# Patient Record
Sex: Male | Born: 1995 | Race: White | Hispanic: No | Marital: Single | Smoking: Never smoker
Health system: Southern US, Community
[De-identification: ages and names within clinical notes are randomized; demographics above are authoritative.]

---
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
## 2017-08-10 ENCOUNTER — Encounter (HOSPITAL_COMMUNITY): Payer: Self-pay | Admitting: Emergency Medicine

## 2017-08-10 ENCOUNTER — Emergency Department (HOSPITAL_COMMUNITY)
Admission: EM | Admit: 2017-08-10 | Discharge: 2017-08-12 | Disposition: A | Discharge status: Home / Self Care | Payer: Self-pay | Attending: Emergency Medicine | Admitting: Emergency Medicine

## 2017-08-10 ENCOUNTER — Other Ambulatory Visit: Payer: Self-pay

## 2017-08-10 DIAGNOSIS — F332 Major depressive disorder, recurrent severe without psychotic features: Secondary | ICD-10-CM | POA: Insufficient documentation

## 2017-08-10 DIAGNOSIS — R45851 Suicidal ideations: Secondary | ICD-10-CM | POA: Insufficient documentation

## 2017-08-10 LAB — COMPREHENSIVE METABOLIC PANEL
ALBUMIN: 4.4 g/dL (ref 3.5–5.0)
ALK PHOS: 39 U/L (ref 38–126)
ALT: 20 U/L (ref 0–44)
AST: 19 U/L (ref 15–41)
Anion gap: 7 (ref 5–15)
BILIRUBIN TOTAL: 1.9 mg/dL — AB (ref 0.3–1.2)
BUN: 14 mg/dL (ref 6–20)
CALCIUM: 9.3 mg/dL (ref 8.9–10.3)
CO2: 30 mmol/L (ref 22–32)
Chloride: 103 mmol/L (ref 98–111)
Creatinine, Ser: 0.98 mg/dL (ref 0.61–1.24)
GFR calc Af Amer: >60 mL/min (ref >60)
GFR calc non Af Amer: >60 mL/min (ref >60)
GLUCOSE: 105 mg/dL — AB (ref 70–99)
POTASSIUM: 3.9 mmol/L (ref 3.5–5.1)
Sodium: 140 mmol/L (ref 135–145)
Total Protein: 7.4 g/dL (ref 6.5–8.1)

## 2017-08-10 LAB — SALICYLATE LEVEL: Salicylate Lvl: <7 mg/dL (ref 2.8–30.0)

## 2017-08-10 LAB — RAPID URINE DRUG SCREEN, HOSP PERFORMED
AMPHETAMINES: NOT DETECTED
BARBITURATES: NOT DETECTED
Benzodiazepines: NOT DETECTED
COCAINE: NOT DETECTED
OPIATES: NOT DETECTED
TETRAHYDROCANNABINOL: NOT DETECTED

## 2017-08-10 LAB — CBC
HEMATOCRIT: 49.1 % (ref 39.0–52.0)
HEMOGLOBIN: 17.4 g/dL — AB (ref 13.0–17.0)
MCH: 30.1 pg (ref 26.0–34.0)
MCHC: 35.4 g/dL (ref 30.0–36.0)
MCV: 84.8 fL (ref 78.0–100.0)
Platelets: 277 10*3/uL (ref 150–400)
RBC: 5.79 MIL/uL (ref 4.22–5.81)
RDW: 13.4 % (ref 11.5–15.5)
WBC: 9.4 10*3/uL (ref 4.0–10.5)

## 2017-08-10 LAB — ETHANOL: Alcohol, Ethyl (B): <10 mg/dL (ref <10)

## 2017-08-10 LAB — ACETAMINOPHEN LEVEL: ACETAMINOPHEN (TYLENOL), SERUM: 12 ug/mL (ref 10–30)

## 2017-08-10 NOTE — ED Triage Notes (Signed)
Pt brought in by [employer] under IVC after making statements that he wanted to kill himself by driving his truck off a bridge. When pt questioned about this, pt stated that he still felt same way.

## 2017-08-10 NOTE — ED Provider Notes (Signed)
[facility] EMERGENCY DEPARTMENT Provider Note   CSN: [id number] Arrival date & time: 08/10/17  2220     History   Chief Complaint Chief Complaint  Patient presents with  . V70.1    HPI Nicholas Clayton is a 22 y.o. male.  Patient brought in by police after making suicidal statements to his mother whom he lives with.  He told his mother he did not want to live anymore and wanted to drive his truck off a bridge.  He states he feels depressed but does not have a plan to hurt himself at this time.  He denies hearing any voices.  He states he has had lots of nightmares recently.  He denies any homicidal thoughts.  He does not take any prescription medications and is never been treated for depression or anxiety.  He denies any illicit drug abuse.  Uses alcohol occasionally last use yesterday.  Denies any physical altercation.  Does complain of headache at this time.  No focal weakness, numbness or tingling.  The history is provided by the patient and the police.    History reviewed. No pertinent past medical history.  There are no active problems to display for this patient.   History reviewed. No pertinent surgical history.      Home Medications    Prior to Admission medications   Medication Sig Start Date End Date Taking? Authorizing Provider  acetaminophen-codeine (TYLENOL #3) 300-30 MG per tablet 1 po q4h prn pain, take with food 10/30/10   Ivery Quale, PA-C    Family History No family history on file.  Social History Social History   Tobacco Use  . Smoking status: Never Smoker  . Smokeless tobacco: Never Used  Substance Use Topics  . Alcohol use: Yes  . Drug use: Yes    Types: Methamphetamines, Marijuana     Allergies   Aspirin   Review of Systems Review of Systems  Constitutional: Negative for activity change, appetite change and fever.  HENT: Negative for congestion.   Respiratory: Negative for cough, chest tightness and shortness of breath.     Cardiovascular: Negative for chest pain.  Gastrointestinal: Negative for abdominal pain, nausea and vomiting.  Genitourinary: Negative for testicular pain.  Musculoskeletal: Negative for arthralgias and myalgias.  Skin: Negative for rash.  Psychiatric/Behavioral: Positive for behavioral problems, decreased concentration, dysphoric mood, self-injury, sleep disturbance and suicidal ideas. The patient is nervous/anxious.    all other systems are negative except as noted in the HPI and PMH.     Physical Exam Updated Vital Signs BP 137/84 (BP Location: Right Arm)   Pulse 76   Temp (!) 97.1 F (36.2 C) (Oral)   Resp 17   Ht 5\' 9"  (1.753 m)   Wt 63.5 kg (140 lb)   SpO2 100%   BMI 20.67 kg/m   Physical Exam  Constitutional: He is oriented to person, place, and time. He appears well-developed and well-nourished. No distress.  Flat affect  HENT:  Head: Normocephalic and atraumatic.  Mouth/Throat: Oropharynx is clear and moist. No oropharyngeal exudate.  Eyes: Pupils are equal, round, and reactive to light. Conjunctivae and EOM are normal.  Neck: Normal range of motion. Neck supple.  No meningismus.  Cardiovascular: Normal rate, regular rhythm, normal heart sounds and intact distal pulses.  No murmur heard. Pulmonary/Chest: Effort normal and breath sounds normal. No respiratory distress.  Abdominal: Soft. There is no tenderness. There is no rebound and no guarding.  Musculoskeletal: Normal range of motion. He exhibits  no edema or tenderness.  Neurological: He is alert and oriented to person, place, and time. No cranial nerve deficit. He exhibits normal muscle tone. Coordination normal.  No ataxia on finger to nose bilaterally. No pronator drift. 5/5 strength throughout. CN 2-12 intact.Equal grip strength. Sensation intact.   Skin: Skin is warm.  Psychiatric: He has a normal mood and affect. His behavior is normal.  Nursing note and vitals reviewed.    ED Treatments / Results   Labs (all labs ordered are listed, but only abnormal results are displayed) Labs Reviewed  COMPREHENSIVE METABOLIC PANEL - Abnormal; Notable for the following components:      Result Value   Glucose, Bld 105 (*)    Total Bilirubin 1.9 (*)    All other components within normal limits  CBC - Abnormal; Notable for the following components:   Hemoglobin 17.4 (*)    All other components within normal limits  ACETAMINOPHEN LEVEL - Abnormal; Notable for the following components:   Acetaminophen (Tylenol), Serum <10 (*)    All other components within normal limits  ETHANOL  SALICYLATE LEVEL  ACETAMINOPHEN LEVEL  RAPID URINE DRUG SCREEN, HOSP PERFORMED    EKG None  Radiology No results found.  Procedures Procedures (including critical care time)  Medications Ordered in ED Medications - No data to display   Initial Impression / Assessment and Plan / ED Course  I have reviewed the triage vital signs and the nursing notes.  Pertinent labs & imaging results that were available during my care of the patient were reviewed by me and considered in my medical decision making (see chart for details).    Patient brought in IVC by police after making suicidal statements. Calm and cooperative at this time.  Will check screening labs, TTS consult.   Screening labs show mild elevation of acetaminophen.  Patient admits to taking 4 pills of unknown dosage throughout the day today.  Repeat acetaminophen levels undetectable.  TTS consult is complete and patient meets inpatient criteria. He is medically clear for psychiatric placement and evaluation.  Holding orders placed.  Final Clinical Impressions(s) / ED Diagnoses   Final diagnoses:  Suicidal ideation    ED Discharge Orders    None       Vetta Couzens, Jeannett SeniorStephen, MD 08/11/17 [phone]

## 2017-08-10 NOTE — ED Notes (Signed)
IVC paperwork received and place in folder at [employer] desk.

## 2017-08-10 NOTE — ED Notes (Signed)
Pt wanded by security in triage. Pt brought by  [employer]  IVC papers were brought in by officer.

## 2017-08-11 LAB — ACETAMINOPHEN LEVEL

## 2017-08-11 MED ORDER — ONDANSETRON HCL 4 MG PO TABS: 4.0000 mg | ORAL_TABLET | Freq: Three times a day (TID) | ORAL | Status: DC | PRN

## 2017-08-11 MED ORDER — HYDROXYZINE HCL 25 MG PO TABS
25.0000 mg | ORAL_TABLET | Freq: Four times a day (QID) | ORAL | Status: DC | PRN
  Administered 2017-08-11 – 2017-08-12 (×2): 25 mg via ORAL
  Filled 2017-08-11 (×2): qty 1

## 2017-08-11 NOTE — ED Notes (Signed)
Pt escorted to the shower by this sitter °

## 2017-08-11 NOTE — ED Notes (Signed)
Pt used telephone to talk to brother.

## 2017-08-11 NOTE — ED Notes (Signed)
Pt refusing to eat. He says he is too nervous to eat.

## 2017-08-11 NOTE — BHH Counselor (Signed)
Clinician called tele-assessment cart and received a error message saying the cart could not be reached.   Clinician contacted Nicholas Clayton and cart issue. Nicholas Clayton reported, she was going to reboot the cart. Clincain expressed she will call the cart in about 20 minutes.   Nicholas Medalrey lese D Estover, MS, LP, CC Triage Specialist [phone]

## 2017-08-11 NOTE — BH Assessment (Addendum)
Tele Assessment Note   Patient Name: Nicholas Abrahamshomas L Jokerst MRN: [id number] Referring Physician: Dr. Manus Gunningancour. Location of Patient: [facility] Location of Provider: Behavioral Health TTS Department  Nicholas Clayton is an 22 y.o. male, who presents involuntary and unaccompanied to [facility]. Clinician asked the pt, "what brought you to the hospital?" Pt reported, he was sleep in bed his brothers work him up and two officers brought him to the hospital. Clinician asked the pt, "why does he think the police brought him to the hospital?" Pt reported, his family called, "I think they're worried about me." Clinician asked, "why are they worried about you?" Pt reported, "I don't know." Pt reported, he has not been himself, since last year around Christmas time when his mother was diagnosed with breast cancer. Pt reported, he began feeling the world is going on but he his standing still. During the assessment pt had difficulty explaining he's feelings. Pt reported, he feels death is easier than life. Pt reported, he has moments of suicidal thoughts and he is unsure of the triggers. Pt reported, he said he wanted to drive his truck off a bridge. Pt reported the following stressors: his cousin might go to jail, his brother's light bill is $1500, he heard his father is back using drugs, he's staying with his mother, having the same job for five years, not moving forward with his life, having a family members car not his own. Pt reported, laying in the bed thinking about his family's issues, which makes it difficult for him to go to sleep. The pt internalizes and creates ownership of his family issues/concerns. Pt reported, for the past three weeks he has been having nightmares about his family where he wakes up shaking. Pt reported, he can have access to weapons if he wanted. Pt denies, HI, AVH, and self-injurious behaviors.    Pt was IVC'd by his mother. Per IVC paperwork: "The respondent said he wanted to kill himself. He stated he  was ready to die. The respondent aid he would drive his truck into the bridge. The respondent is a danger to himself and needs to be examined."   Pt denies, abuse and substance use. Pt reported, drinking alcohol, last night. Pt can't recall the amount or type of alcohol consumed. Pt's UDS is negative. Pt denies, being linked to OPT resources (medication management and/or counseling.) Pt denies, previous inpatient admissions.  Pt presents crying quiet/awake in scrubs with logical/coherent speech. Pt's mood was depressed, helpless, despair. Pt's affect was flat. Pt's thought process was coherent/relevant. Pt's judgement was partial. Pt was oriented x3. Pt's concentration was normal. Pt's insight and impulse control was fair. Pt reported, if discharged from [facility] he could contract for safety. Clinician discussed three possible dispositions (discharge observation/re-evaluation, and inpatient treatment) with the pt.   Diagnosis: F33.2 Major Depressive Disorder, recurrent, severe without psychotic features.     Past Medical History: History reviewed. No pertinent past medical history.  History reviewed. No pertinent surgical history.  Family History: No family history on file.  Social History:  reports that he has never smoked. He has never used smokeless tobacco. He reports that he drinks alcohol. He reports that he has current or past drug history. Drugs: Methamphetamines and Marijuana.  Additional Social History:  Alcohol / Drug Use Pain Medications: See MAR Prescriptions: See MAR Over the Counter: See MAR History of alcohol / drug use?: Yes Substance #1 Name of Substance 1: Alcohol.  1 - Age of First Use: UTA. 1 - Amount (  size/oz): Pt reported, drinking alcohol, last night. Pt can't recall the amount or type of alcohol consumed.  1 - Frequency: UTA 1 - Duration: UTA 1 - Last Use / Amount: UTA  CIWA: CIWA-Ar BP: 137/84 Pulse Rate: 76 COWS:    Allergies:  Allergies  Allergen Reactions   . Aspirin Itching    Home Medications:  (Not in a hospital admission)  OB/GYN Status:  No LMP for male patient.  General Assessment Data Assessment unable to be completed: Yes Reason for not completing assessment: Error message on cart. Cart to be rebooted. Clinicain to check back.  Location of Assessment: [facility] TTS Assessment: In system Is this a Tele or Face-to-Face Assessment?: Tele Assessment Is this an Initial Assessment or a Re-assessment for this encounter?: Initial Assessment Marital status: Single Living Arrangements: Parent, Other relatives Can pt return to current living arrangement?: Yes Admission Status: Involuntary Is patient capable of signing voluntary admission?: No Referral Source: Self/Family/Friend Insurance type: Self-pay.      Crisis Care Plan Living Arrangements: Parent, Other relatives Legal Guardian: Other:(Self. ) Name of Psychiatrist: NA Name of Therapist: NA  Education Status Is patient currently in school?: No Is the patient employed, unemployed or receiving disability?: Employed  Risk to self with the past 6 months What has been your use of drugs/alcohol within the last 12 months?: Alcohol.  Previous Attempts/Gestures: No How many times?: 0 Other Self Harm Risks: Pt denies.  Triggers for Past Attempts: None known Intentional Self Injurious Behavior: None(Pt denies. ) Family Suicide History: No Recent stressful life event(s): Other (Comment)(Family, cousin going to jail, mom's cancer, ) Persecutory voices/beliefs?: No Depression: Yes Depression Symptoms: Feeling angry/irritable, Feeling worthless/self pity, Loss of interest in usual pleasures, Guilt, Fatigue, Isolating, Tearfulness, Insomnia, Despondent Substance abuse history and/or treatment for substance abuse?: No Suicide prevention information given to non-admitted patients: Not applicable  Risk to Others within the past 6 months Homicidal Ideation: No(Pt denies. ) Does patient  have any lifetime risk of violence toward others beyond the six months prior to admission? : No(Pt denies. ) Thoughts of Harm to Others: No(Pt denies. ) Current Homicidal Intent: No(Pt denies. ) Current Homicidal Plan: No(Pt denies. ) Access to Homicidal Means: No Identified Victim: NA History of harm to others?: No Assessment of Violence: None Noted Violent Behavior Description: NA Does patient have access to weapons?: No(Pt denies. ) Criminal Charges Pending?: No Does patient have a court date: No Is patient on probation?: No  Psychosis Hallucinations: None noted Delusions: None noted  Mental Status Report Appearance/Hygiene: In scrubs Eye Contact: Fair Motor Activity: Unremarkable Speech: Logical/coherent Level of Consciousness: Crying, Quiet/awake Mood: Depressed, Helpless, Despair Affect: Flat Anxiety Level: Moderate Thought Processes: Coherent, Relevant Judgement: Partial Orientation: Person, Place, Time Obsessive Compulsive Thoughts/Behaviors: None  Cognitive Functioning Concentration: Normal Memory: Recent Intact Is patient IDD: No Is patient DD?: No Insight: Fair Impulse Control: Fair Appetite: Poor Sleep: Decreased Total Hours of Sleep: (Pt reported, a few hours.) Vegetative Symptoms: Staying in bed  ADLScreening [facility] Assessment Services) Patient's cognitive ability adequate to safely complete daily activities?: Yes Patient able to express need for assistance with ADLs?: Yes Independently performs ADLs?: Yes (appropriate for developmental age)  Prior Inpatient Therapy Prior Inpatient Therapy: No  Prior Outpatient Therapy Prior Outpatient Therapy: No Does patient have an ACCT team?: No Does patient have Intensive In-House Services?  : No Does patient have [facility] services? : No Does patient have P4CC services?: No  ADL Screening (condition at time of admission)  Patient's cognitive ability adequate to safely complete daily activities?: Yes Is the  patient deaf or have difficulty hearing?: No Does the patient have difficulty seeing, even when wearing glasses/contacts?: No Does the patient have difficulty concentrating, remembering, or making decisions?: Yes Patient able to express need for assistance with ADLs?: Yes Does the patient have difficulty dressing or bathing?: No Independently performs ADLs?: Yes (appropriate for developmental age) Does the patient have difficulty walking or climbing stairs?: No Weakness of Legs: None Weakness of Arms/Hands: None  Home Assistive Devices/Equipment Home Assistive Devices/Equipment: None    Abuse/Neglect Assessment (Assessment to be complete while patient is alone) Abuse/Neglect Assessment Can Be Completed: Yes Physical Abuse: Denies(Pt denies. ) Verbal Abuse: Denies(Pt denies. ) Sexual Abuse: Denies(Pt denies. ) Exploitation of patient/patient's resources: Denies(Pt denies. ) Self-Neglect: Denies(Pt denies. )         Disposition: Nicholas Sievert, PA recommends inpatient treatment. Disposition discussed with Dr. Manus Gunning. Dr. Manus Gunning to inform Ginger, RN of disposition. TTS to seek placement.    Disposition Initial Assessment Completed for this Encounter: Yes  This service was provided via telemedicine using a 2-way, interactive audio and video technology.  Names of all persons participating in this telemedicine service and their role in this encounter.               Redmond Pulling 08/11/2017 1:20 AM   Redmond Pulling, MS, [facility], [facility] Triage Specialist [phone]

## 2017-08-11 NOTE — ED Notes (Signed)
[facility]

## 2017-08-11 NOTE — ED Notes (Signed)
Pt given dinner tray.

## 2017-08-11 NOTE — ED Notes (Signed)
Pt given breakfast tray

## 2017-08-11 NOTE — ED Notes (Addendum)
Pt given lunch tray.

## 2017-08-11 NOTE — Progress Notes (Signed)
Pt meets inpatient criteria per Donell SievertSpencer Simon, PA. Referral information has been sent [facility] the [facility]: [facility]  [facility]  [facility]  [facility]   CSW will continue to assist with placement needs. CSW spoke with Durward Mallardamille in [facility] and assessed that there was currently no bed availability but that admissions staff will consider pt if bed availability changes tomorrow.   Wells GuilesSarah Jaely Silman, LCSW, LCAS Disposition CSW [facility]/TTS [phone]

## 2017-08-12 ENCOUNTER — Encounter: Payer: Self-pay | Admitting: Psychiatry

## 2017-08-12 DIAGNOSIS — F322 Major depressive disorder, single episode, severe without psychotic features: Secondary | ICD-10-CM | POA: Diagnosis present

## 2017-08-12 DIAGNOSIS — R45851 Suicidal ideations: Secondary | ICD-10-CM
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
## 2017-08-12 NOTE — BH Assessment (Signed)
[facility] by Dr. Jennet MaduroPucilowska.  Attending Physician will be Dr. Jennet MaduroPucilowska.   Patient has been assigned to room [facility]B, [facility] T'Y[facility].    [facility] Disposition SW Wells GuilesSarah Marshall has been made aware.

## 2017-08-12 NOTE — Progress Notes (Signed)
[facility]; room #315-B Dr. Homero FellersPusalowska is the accepting/attending provider.   Call report to [phone] [facility] notified. Pt is involuntary and will need to be transported by law enforcement.  [facility] admissions staff will notify [facility] RN about arrival time when calling in report.   Wells GuilesSarah Icela Glymph, LCSW, LCAS Disposition CSW [facility] B[facility]/TTS [phone]

## 2017-08-12 NOTE — ED Provider Notes (Signed)
  Physical Exam  BP 113/74   Pulse 74   Temp 98 F (36.7 C)   Resp 18   Ht 5\' 9"  (1.753 m)   Wt 63.5 kg (140 lb)   SpO2 100%   BMI 20.67 kg/m   Physical Exam  ED Course/Procedures     Procedures  MDM  Accepted at [facility] by Dr Charlyne PetrinPusalowska       Nicholas Hidrogo, MD 08/12/17 2228

## 2017-08-13 ENCOUNTER — Inpatient Hospital Stay
Admission: AD | Admit: 2017-08-13 | Discharge: 2017-08-14 | DRG: 885 | Disposition: A | Discharge status: Home / Self Care | Payer: No Typology Code available for payment source | Attending: Psychiatry | Admitting: Psychiatry

## 2017-08-13 ENCOUNTER — Other Ambulatory Visit: Payer: Self-pay

## 2017-08-13 DIAGNOSIS — F411 Generalized anxiety disorder: Secondary | ICD-10-CM | POA: Diagnosis present

## 2017-08-13 DIAGNOSIS — Z818 Family history of other mental and behavioral disorders: Secondary | ICD-10-CM

## 2017-08-13 DIAGNOSIS — R45851 Suicidal ideations: Secondary | ICD-10-CM | POA: Diagnosis present

## 2017-08-13 DIAGNOSIS — F322 Major depressive disorder, single episode, severe without psychotic features: Secondary | ICD-10-CM | POA: Diagnosis present

## 2017-08-13 DIAGNOSIS — F1220 Cannabis dependence, uncomplicated: Secondary | ICD-10-CM | POA: Diagnosis present

## 2017-08-13 DIAGNOSIS — F332 Major depressive disorder, recurrent severe without psychotic features: Secondary | ICD-10-CM | POA: Diagnosis present

## 2017-08-13 LAB — HEMOGLOBIN A1C
HEMOGLOBIN A1C: 4.8 % (ref 4.8–5.6)
Mean Plasma Glucose: 91.06 mg/dL

## 2017-08-13 LAB — LIPID PANEL
CHOLESTEROL: 130 mg/dL (ref 0–200)
HDL: 48 mg/dL (ref >40)
LDL Cholesterol: 69 mg/dL (ref 0–99)
Total CHOL/HDL Ratio: 2.7 RATIO
Triglycerides: 65 mg/dL (ref <150)
VLDL: 13 mg/dL (ref 0–40)

## 2017-08-13 LAB — TSH: TSH: 3.002 u[IU]/mL (ref 0.350–4.500)

## 2017-08-13 MED ORDER — RISPERIDONE 1 MG PO TABS
1.0000 mg | ORAL_TABLET | Freq: Every day | ORAL | Status: DC
  Administered 2017-08-13: 1 mg via ORAL
  Filled 2017-08-13: qty 1

## 2017-08-13 MED ORDER — ACETAMINOPHEN 325 MG PO TABS: 650.0000 mg | ORAL_TABLET | Freq: Four times a day (QID) | ORAL | Status: DC | PRN

## 2017-08-13 MED ORDER — HYDROXYZINE HCL 25 MG PO TABS: 25.0000 mg | ORAL_TABLET | Freq: Three times a day (TID) | ORAL | Status: DC | PRN

## 2017-08-13 MED ORDER — TRAZODONE HCL 100 MG PO TABS: 100.0000 mg | ORAL_TABLET | Freq: Every evening | ORAL | Status: DC | PRN

## 2017-08-13 MED ORDER — ALUM & MAG HYDROXIDE-SIMETH 200-200-20 MG/5ML PO SUSP: 30.0000 mL | ORAL | Status: DC | PRN

## 2017-08-13 MED ORDER — MAGNESIUM HYDROXIDE 400 MG/5ML PO SUSP: 30.0000 mL | Freq: Every day | ORAL | Status: DC | PRN

## 2017-08-13 MED ORDER — FLUOXETINE HCL 20 MG PO CAPS
20.0000 mg | ORAL_CAPSULE | Freq: Every day | ORAL | Status: DC
  Administered 2017-08-13 – 2017-08-14 (×2): 20 mg via ORAL
  Filled 2017-08-13 (×2): qty 1

## 2017-08-13 NOTE — Plan of Care (Signed)
Verbalize understanding of medication  given and standing  working on coping  skills . Attending unit programing  patient  able to verbalize feeling of self . Information received  in concrete form for better understanding.  Voice no concerns around sleep or safety concerns .   Problem: Education: Goal: Knowledge of the prescribed therapeutic regimen will improve Outcome: Progressing   Problem: Coping: Goal: Coping ability will improve Outcome: Progressing Goal: Will verbalize feelings Outcome: Progressing   Problem: Health Behavior/Discharge Planning: Goal: Compliance with therapeutic regimen will improve Outcome: Progressing   Problem: Self-Concept: Goal: Will verbalize positive feelings about self Outcome: Progressing   Problem: Education: Goal: Knowledge of [facility] Education information/materials will improve Outcome: Progressing   Problem: Activity: Goal: Interest or engagement in activities will improve Outcome: Progressing Goal: Sleeping patterns will improve Outcome: Progressing   Problem: Safety: Goal: Periods of time without injury will increase Outcome: Progressing   Problem: Coping: Goal: Demonstration of participation in decision-making regarding own care will improve Outcome: Progressing   Problem: Self-Concept: Goal: Will verbalize positive feelings about self Outcome: Progressing
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
## 2017-08-13 NOTE — Progress Notes (Signed)
Recreation Therapy Notes          Marvin Grabill 08/13/2017 12:04 PM 

## 2017-08-13 NOTE — Progress Notes (Signed)
D: Patient stated slept good last night .Stated appetite is good and energy level  Is normal. Stated concentration is good . Stated on Depression scale 0, hopeless 0 and anxiety 0 .( low 0-10 high) Denies suicidal  homicidal ideations  .  Verbalize understanding of medication  given and standing  working on coping  skills . Attending unit programing  patient  able to verbalize feeling of self . Information received  in concrete form for better understanding.  Voice no concerns around sleep or safety concerns .No auditory hallucinations  No pain concerns . Appropriate ADL'S. Interacting with peers and staff.   A: Encourage patient participation with unit programming . Instruction  Given on  Medication , verbalize understanding. R: Voice no other concerns. Staff continue to monitor
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
## 2017-08-13 NOTE — Tx Team (Addendum)
Interdisciplinary Treatment and Diagnostic Plan Update  08/13/2017 Time of Session: 10:30am Nicholas Clayton MRN: [id number]  Principal Diagnosis: Major depressive disorder, single episode, severe without psychosis (HCC)  Secondary Diagnoses: Principal Problem:   Major depressive disorder, single episode, severe without psychosis (HCC) Active Problems:   Suicidal ideation   Cannabis use disorder, moderate, dependence (HCC)   Current Medications:  Current Facility-Administered Medications  Medication Dose Route Frequency Provider Last Rate Last Dose  . acetaminophen (TYLENOL) tablet 650 mg  650 mg Oral Q6H PRN Pucilowska, Jolanta B, MD      . alum & mag hydroxide-simeth (MAALOX/MYLANTA) 200-200-20 MG/5ML suspension 30 mL  30 mL Oral Q4H PRN Pucilowska, Jolanta B, MD      . FLUoxetine (PROZAC) capsule 20 mg  20 mg Oral Daily Pucilowska, Jolanta B, MD      . hydrOXYzine (ATARAX/VISTARIL) tablet 25 mg  25 mg Oral TID PRN Pucilowska, Jolanta B, MD      . magnesium hydroxide (MILK OF MAGNESIA) suspension 30 mL  30 mL Oral Daily PRN Pucilowska, Jolanta B, MD      . risperiDONE (RISPERDAL) tablet 1 mg  1 mg Oral QHS Pucilowska, Jolanta B, MD      . traZODone (DESYREL) tablet 100 mg  100 mg Oral QHS PRN Pucilowska, Jolanta B, MD       PTA Medications: No medications prior to admission.    Patient Stressors: Medication change or noncompliance  Patient Strengths: Ability for insight Average or above average intelligence Capable of independent living Supportive family/friends  Treatment Modalities: Medication Management, Group therapy, Case management,  1 to 1 session with clinician, Psychoeducation, Recreational therapy.   Physician Treatment Plan for Primary Diagnosis: Major depressive disorder, single episode, severe without psychosis (HCC) Long Term Goal(s): Improvement in symptoms so as ready for discharge Improvement in symptoms so as ready for discharge   Short Term Goals:  Ability to identify changes in lifestyle to reduce recurrence of condition will improve Ability to verbalize feelings will improve Ability to disclose and discuss suicidal ideas Ability to demonstrate self-control will improve Ability to identify and develop effective coping behaviors will improve Ability to maintain clinical measurements within normal limits will improve Ability to identify triggers associated with substance abuse/mental health issues will improve Ability to identify changes in lifestyle to reduce recurrence of condition will improve Ability to demonstrate self-control will improve Ability to identify triggers associated with substance abuse/mental health issues will improve  Medication Management: Evaluate patient's response, side effects, and tolerance of medication regimen.  Therapeutic Interventions: 1 to 1 sessions, Unit Group sessions and Medication administration.  Evaluation of Outcomes: Progressing  Physician Treatment Plan for Secondary Diagnosis: Principal Problem:   Major depressive disorder, single episode, severe without psychosis (HCC) Active Problems:   Suicidal ideation   Cannabis use disorder, moderate, dependence (HCC)  Long Term Goal(s): Improvement in symptoms so as ready for discharge Improvement in symptoms so as ready for discharge   Short Term Goals: Ability to identify changes in lifestyle to reduce recurrence of condition will improve Ability to verbalize feelings will improve Ability to disclose and discuss suicidal ideas Ability to demonstrate self-control will improve Ability to identify and develop effective coping behaviors will improve Ability to maintain clinical measurements within normal limits will improve Ability to identify triggers associated with substance abuse/mental health issues will improve Ability to identify changes in lifestyle to reduce recurrence of condition will improve Ability to demonstrate self-control will  improve Ability to identify triggers associated  with substance abuse/mental health issues will improve     Medication Management: Evaluate patient's response, side effects, and tolerance of medication regimen.  Therapeutic Interventions: 1 to 1 sessions, Unit Group sessions and Medication administration.  Evaluation of Outcomes: Progressing   RN Treatment Plan for Primary Diagnosis: Major depressive disorder, single episode, severe without psychosis (HCC) Long Term Goal(s): Knowledge of disease and therapeutic regimen to maintain health will improve  Short Term Goals: Ability to verbalize feelings will improve, Ability to identify and develop effective coping behaviors will improve and Compliance with prescribed medications will improve  Medication Management: RN will administer medications as ordered by provider, will assess and evaluate patient's response and provide education to patient for prescribed medication. RN will report any adverse and/or side effects to prescribing provider.  Therapeutic Interventions: 1 on 1 counseling sessions, Psychoeducation, Medication administration, Evaluate responses to treatment, Monitor vital signs and CBGs as ordered, Perform/monitor CIWA, COWS, AIMS and Fall Risk screenings as ordered, Perform wound care treatments as ordered.  Evaluation of Outcomes: Progressing   LCSW Treatment Plan for Primary Diagnosis: Major depressive disorder, single episode, severe without psychosis (HCC) Long Term Goal(s): Safe transition to appropriate next level of care at discharge, Engage patient in therapeutic group addressing interpersonal concerns.  Short Term Goals: Engage patient in aftercare planning with referrals and resources, Increase social support, Increase ability to appropriately verbalize feelings, Identify triggers associated with mental health/substance abuse issues and Increase skills for wellness and recovery  Therapeutic Interventions: Assess for all  discharge needs, 1 to 1 time with Social worker, Explore available resources and support systems, Assess for adequacy in community support network, Educate family and significant other(s) on suicide prevention, Complete Psychosocial Assessment, Interpersonal group therapy.  Evaluation of Outcomes: Progressing   Progress in Treatment: Attending groups: No. Participating in groups: No. Taking medication as prescribed: Yes. Toleration medication: Yes. Family/Significant other contact made: No, will contact:  Patients mother Patient understands diagnosis: Yes. Discussing patient identified problems/goals with staff: Yes. Medical problems stabilized or resolved: Yes. Denies suicidal/homicidal ideation: Yes. Issues/concerns per patient self-inventory: No. Other:   New problem(s) identified: No, Describe:  None  New Short Term/Long Term Goal(s): "To get well enough to go home."  Patient Goals:  "To get well enough to go home."  Discharge Plan or Barriers: To return home and follow up with [facility].   Reason for Continuation of Hospitalization: Depression Medication stabilization  Estimated Length of Stay: 3--5 days  Recreational Therapy: Patient Stressors: N/A Patient Goal: Patient will identify 3 positive coping skills strategies to use post d/c within 5 recreation therapy group sessions  Attendees: Patient: Nicholas Clayton 08/13/2017 2:09 PM  Physician: Dr. Jennet Maduro, MD 08/13/2017 2:09 PM  Nursing: Hulan Amato, RN 08/13/2017 2:09 PM  RN Care Manager: 08/13/2017 2:09 PM  Social Worker: Johny Shears, LCSWA 08/13/2017 2:09 PM  Recreational Therapist: Danella Deis. Dreama Saa, LRT 08/13/2017 2:09 PM  Other: Jake Shark, LCSW 08/13/2017 2:09 PM  Other: Damian Leavell, [place] 08/13/2017 2:09 PM  Other: 08/13/2017 2:09 PM    Scribe for Treatment Team: Johny Shears, LCSW 08/13/2017 2:09 PM
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
## 2017-08-13 NOTE — [facility] Suicide Risk Assessment (Signed)
[facility] INPATIENT:  Family/Significant Other Suicide Prevention Education  Suicide Prevention Education:  Contact Attempts: Nicholas FrayBetty Clayton, Paitents mother, [phone] has been identified by the patient as the family member/significant other with whom the patient will be residing, and identified as the person(s) who will aid the patient in the event of a mental health crisis.  With written consent from the patient, two attempts were made to provide suicide prevention education, prior to and/or following the patient's discharge.  We were unsuccessful in providing suicide prevention education.  A suicide education pamphlet was given to the patient to share with family/significant other.  Date and time of first attempt: CSW attempted to contact the patients family member/ significant other to complete SPE and obtain collateral information. CSW unable to leave a voicemail.  08/13/2017 2:13 PM  Date and time of second attempt:  Nicholas Clayton  Nicholas Clayton 08/13/2017, 2:13 PM

## 2017-08-13 NOTE — [facility] Group Notes (Signed)
[facility] Group Notes:  (Nursing/MHT/Case Management/Adjunct)  Date:  08/13/2017  Time:  9:34 PM  Type of Therapy:  Group Therapy  Participation Level:  Did Not Attend   Mayra NeerJackie L Emilee Market 08/13/2017, 9:34 PM

## 2017-08-13 NOTE — Progress Notes (Signed)
Recreation Therapy Notes  INPATIENT RECREATION THERAPY ASSESSMENT  Patient Details Name: Nicholas Clayton MRN: [id number] DOB: 1995-07-15 Today's Date: 08/13/2017       Information Obtained From: Patient  Able to Participate in Assessment/Interview: Yes  Patient Presentation: Responsive  Reason for Admission (Per Patient): Suicide Attempt, Other (Comments)(depression)  Patient Stressors:    Coping Skills:   (I do not have any)  Leisure Interests (2+):  Nature - Estate agentishing([place])  Frequency of Recreation/Participation: Chief Executive OfficerMonthly  Awareness of Community Resources:  No  Community Resources:     Current Use:    If no, Barriers?:    Expressed Interest in State Street CorporationCommunity Resource Information: Yes  [place] of [place]idence[place]  Patient Main Form of Transportation: Set designerCar  Patient Strengths:  I do not know  Patient Identified Areas of Improvement:  I do not know  Patient Goal for Hospitalization:  To get well enough to go home  Current SI (including self-harm):  No  Current HI:  No  Current AVH: No  Staff Intervention Plan: Group Attendance, Collaborate with Interdisciplinary Treatment Team  Consent to Intern Participation: N/A  Ramiah Helfrich 08/13/2017, 3:44 PM
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
## 2017-08-14 DIAGNOSIS — Z5181 Encounter for therapeutic drug level monitoring: Secondary | ICD-10-CM

## 2017-08-14 MED ORDER — FLUOXETINE HCL 20 MG PO CAPS: 40.0000 mg | ORAL_CAPSULE | Freq: Every day | ORAL | Status: DC

## 2017-08-14 MED ORDER — RISPERIDONE 1 MG PO TABS: 1.0000 mg | ORAL_TABLET | Freq: Every day | ORAL | 1 refills | Status: DC

## 2017-08-14 MED ORDER — FLUOXETINE HCL 40 MG PO CAPS: 40.0000 mg | ORAL_CAPSULE | Freq: Every day | ORAL | 1 refills | Status: DC

## 2017-08-14 MED ORDER — HYDROXYZINE HCL 25 MG PO TABS
25.0000 mg | ORAL_TABLET | Freq: Three times a day (TID) | ORAL | 1 refills | Status: DC | PRN
Start: 2017-08-14 — End: 2018-02-12

## 2017-08-14 NOTE — Progress Notes (Signed)
Recreation Therapy Notes  Date: 08/14/2017  Time: 9:30 pm   Location: Craft Room   Behavioral response: N/A   Intervention Topic: Self-care  Discussion/Intervention: Patient did not attend group.   Clinical Observations/Feedback:  Patient did not attend group.   Brieana Shimmin LRT/CTRS        Carlyne Keehan 08/14/2017 10:53 AM

## 2017-08-14 NOTE — Progress Notes (Signed)
Recreation Therapy Notes  INPATIENT RECREATION TR PLAN  Patient Details Name: Nicholas Clayton MRN: [id number] DOB: 10/04/1995 Today's Date: 08/14/2017  Rec Therapy Plan Is patient appropriate for Therapeutic Recreation?: Yes Treatment times per week: at least 3 Estimated Length of Stay: 5-7 days TR Treatment/Interventions: Group participation (Comment)  Discharge Criteria Pt will be discharged from therapy if:: Discharged Treatment plan/goals/alternatives discussed and agreed upon by:: Patient/family  Discharge Summary Short term goals set: Patient will identify 3 positive coping skills strategies to use post d/c within 5 recreation therapy group sessions Short term goals met: Not met Reason goals not met: Patient did not attend any groups Therapeutic equipment acquired: N/A Reason patient discharged from therapy: Discharge from hospital Pt/family agrees with progress & goals achieved: Yes Date patient discharged from therapy: 08/14/17   Raeshawn Tafolla 08/14/2017, 4:05 PM
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
## 2017-08-14 NOTE — Progress Notes (Signed)
D: Patient is aware of  Discharge this shift .Patient denies suicidal /homicidal ideations. Patient received all belongings brought in Verbalize understanding of medication  given  working on coping  skills . Attending unit programing  patient  able to verbalize feeling of self . Information received  in concrete form for better understanding.  Voice no concerns around sleep or safety concerns .  A: No Storage medications. Writer reviewed Discharge Summary, Suicide Risk Assessment, and Transitional Record. Patient also received Prescriptions   from  MD. A 14 day supply of medications given to patient Aware  Of follow up appointment .  R: Patient left unit with no questions  Or concerns  With family
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
## 2017-08-14 NOTE — [facility] Suicide Risk Assessment (Signed)
[facility]   Principal Problem: Major depressive disorder, single episode, severe without psychosis (HCC) Discharge Diagnoses:  Patient Active Problem List   Diagnosis Date Noted  . Major depressive disorder, single episode, severe without psychosis (HCC) [F32.2] 08/12/2017    Priority: High  . Cannabis use disorder, moderate, dependence (HCC) [F12.20] 08/13/2017  . Suicidal ideation [R45.851] 08/12/2017    Total Time spent with patient: 20 minutes  Musculoskeletal: Strength & Muscle Tone: within normal limits Gait & Station: normal Patient leans: N/A  Psychiatric Specialty Exam: Review of Systems  Neurological: Negative.   Psychiatric/Behavioral: Negative.   All other systems reviewed and are negative.   Blood pressure 108/77, pulse (!) 113, temperature 97.6 F (36.4 C), temperature source Oral, resp. rate 18, height 5\' 9"  (1.753 m), weight 61.7 kg (136 lb), SpO2 100 %.Body mass index is 20.08 kg/m.  General Appearance: Casual  Eye Contact::  Good  Speech:  Clear and Coherent409  Volume:  Normal  Mood:  Euthymic  Affect:  Appropriate  Thought Process:  Goal Directed and Descriptions of Associations: Intact  Orientation:  Full (Time, Place, and Person)  Thought Content:  WDL  Suicidal Thoughts:  No  Homicidal Thoughts:  No  Memory:  Immediate;   Fair Recent;   Fair Remote;   Fair  Judgement:  Impaired  Insight:  Present  Psychomotor Activity:  Normal  Concentration:  Fair  Recall:  [employer]Fair  Fund of Knowledge:Fair  Language: Fair  Akathisia:  No  Handed:  Right  AIMS (if indicated):     Assets:  Communication Skills Desire for Improvement Housing Physical Health Resilience Social Support Transportation  Sleep:  Number of Hours: 6.15  Cognition: WNL  ADL's:  Intact   Mental Status Per Nursing Assessment::   On Admission:  Suicidal ideation indicated by patient, Suicide plan, Plan includes specific time, place, or  method  Demographic Factors:  Male, Adolescent or young adult, Caucasian and Low socioeconomic status  Loss Factors: Loss of significant relationship and Financial problems/change in socioeconomic status  Historical Factors: Impulsivity  Risk Reduction Factors:   Responsible for children under 24 years of age, Sense of responsibility to family, Employed, Living with another person, especially a relative and Positive social support  Continued Clinical Symptoms:  Depression:   Insomnia  Cognitive Features That Contribute To Risk:  None    Suicide Risk:  Minimal: No identifiable suicidal ideation.  Patients presenting with no risk factors but with morbid ruminations; may be classified as minimal risk based on the severity of the depressive symptoms  Follow-up Information    [facility]. Go on 08/18/2017.   Why:  Please follow up on Monday August 5th at 10am for your hospital follow yp appointment. Please bring your identification and proof of household income. Thank you. Contact information: [address] [place] [address] [phone]        Vocational Rehabilitation Follow up.   Why:  Clinical Social Worker completed a referral for you. The agency will call you back to set up an appointment. Thank you. Contact information: Address: [address], [place], [place] [address] Phone: [phone]          Plan Of Care/Follow-up recommendations:  Activity:  as tolerated Diet:  regular Other:  keep follow up appointments  Kristine LineaJolanta Kenedee Molesky, MD 08/14/2017, 9:44 AM

## 2017-08-14 NOTE — [facility] Group Notes (Addendum)
LCSW Group Therapy Note 08/14/2017 9:00 AM  Type of Therapy and Topic:  Group Therapy:  Setting Goals  Participation Level:  Active  Description of Group: In this process group, patients discussed using strengths to work toward goals and address challenges.  Patients identified two positive things about themselves and one goal they were working on.  Patients were given the opportunity to share openly and support each other's plan for self-empowerment.  The group discussed the value of gratitude and were encouraged to have a daily reflection of positive characteristics or circumstances.  Patients were encouraged to identify a plan to utilize their strengths to work on current challenges and goals.  Therapeutic Goals 1. Patient will verbalize personal strengths/positive qualities and relate how these can assist with achieving desired personal goals 2. Patients will verbalize affirmation of peers plans for personal change and goal setting 3. Patients will explore the value of gratitude and positive focus as related to successful achievement of goals 4. Patients will verbalize a plan for regular reinforcement of personal positive qualities and circumstances.  Summary of Patient Progress:  Nicholas Clayton actively participated in today's group discussion on setting goals using the SMART Model.  Nicholas Clayton stated that he had a good understanding of how he can use the SMART Model to establish goals for himself while inpatient and when he is discharged back to his community.  Nicholas Clayton shared that a goal that he is working on while in the [facility] is "do whatever I need to do to get out of here. If that means attending groups then that is what I will do."     Therapeutic Modalities Cognitive Behavioral Therapy Motivational Interviewing    Alease FrameSonya S Aleayah Chico, LCSW 08/14/2017 12:24 PM

## 2017-08-14 NOTE — Plan of Care (Signed)
Visitation by 5 family members was very supportive; denied SI/HI/AVH, neat in appearance, quiet, polite, respects personal space of pthers.  Patient slept for Estimated Hours of 6.15; Precautionary checks every 15 minutes for safety maintained, room free of safety hazards, patient sustains no injury or falls during this shift.  Problem: Education: Goal: Knowledge of the prescribed therapeutic regimen will improve Outcome: Progressing   Problem: Coping: Goal: Coping ability will improve Outcome: Progressing Goal: Will verbalize feelings Outcome: Progressing   Problem: Health Behavior/Discharge Planning: Goal: Compliance with therapeutic regimen will improve Outcome: Progressing   Problem: Self-Concept: Goal: Will verbalize positive feelings about self Outcome: Progressing   Problem: Activity: Goal: Sleeping patterns will improve Outcome: Progressing   Problem: Safety: Goal: Periods of time without injury will increase Outcome: Progressing   Problem: Self-Concept: Goal: Will verbalize positive feelings about self Outcome: Progressing

## 2017-08-14 NOTE — Progress Notes (Signed)
  [facility] :  Will you be returning to the same living situation after discharge:  Yes,  Home with family At discharge, do you have transportation home?: Yes,  Family will come at discharge Do you have the ability to pay for your medications: Yes,  Referred to a provider who can assist  Release of information consent forms completed and in the chart;  Patient's signature needed at discharge.  Patient to Follow up at: Follow-up Information    [facility]. Go on 08/18/2017.   Why:  Please follow up on Monday August 5th at 10am for your hospital follow yp appointment. Please bring your identification and proof of household income. Thank you. Contact information: [address] [place] [address] [phone]        [facility] Follow up.   Why:  The agency will call you tomorrow 08/15/2017 to set up an appointment. Thank you. Contact information: Address: [address], [place], [place] [address] Phone: [phone]          Next level of care provider [facility]:no  Safety Planning and Suicide Prevention discussed: Yes,  Completed with patient and his mother [name].     Has patient been referred to the Quitline?: N/A patient is not a smoker  Patient has been referred for addiction treatment: N/A  Nicholas ShearsCassandra  Derrius Furtick, LCSW 08/14/2017, 9:48 AM

## 2018-02-12 ENCOUNTER — Encounter (HOSPITAL_COMMUNITY): Payer: Self-pay | Admitting: Emergency Medicine

## 2018-02-12 ENCOUNTER — Other Ambulatory Visit: Payer: Self-pay

## 2018-02-12 ENCOUNTER — Emergency Department (HOSPITAL_COMMUNITY)
Admission: EM | Admit: 2018-02-12 | Discharge: 2018-02-12 | Disposition: A | Discharge status: Home / Self Care | Payer: Self-pay | Attending: Emergency Medicine | Admitting: Emergency Medicine

## 2018-02-12 DIAGNOSIS — Z113 Encounter for screening for infections with a predominantly sexual mode of transmission: Secondary | ICD-10-CM | POA: Insufficient documentation

## 2018-02-12 DIAGNOSIS — R21 Rash and other nonspecific skin eruption: Secondary | ICD-10-CM | POA: Insufficient documentation

## 2018-02-12 MED ORDER — HYDROCORTISONE 0.5 % EX CREA: 1.0000 "application " | TOPICAL_CREAM | Freq: Four times a day (QID) | CUTANEOUS | 1 refills | Status: AC

## 2018-02-12 NOTE — ED Provider Notes (Signed)
Emergency Department Provider Note   I have reviewed the triage vital signs and the nursing notes.   HISTORY  Chief Complaint Exposure to STD   HPI Nicholas Clayton is a 23 y.o. male who spilled hot water on his genitals a couple weeks ago and had a rash that was improving but then went back to work and it became itchy again.  But then he also had unprotected oral intercourse this weekend.  No dysuria or discharge but wants to be checked for STDs as well. No other associated or modifying symptoms.    History reviewed. No pertinent past medical history.  Patient Active Problem List   Diagnosis Date Noted  . Major depressive disorder, single episode, severe without psychosis (HCC) 08/12/2017  . Suicidal ideation 08/12/2017    History reviewed. No pertinent surgical history.  Current Outpatient Rx  . Order #: [id number] Class: Print    Allergies Aspirin  History reviewed. No pertinent family history.  Social History Social History   Tobacco Use  . Smoking status: Never Smoker  . Smokeless tobacco: Never Used  Substance Use Topics  . Alcohol use: Yes    Comment: socially   . Drug use: Not Currently    Types: Methamphetamines, Marijuana    Review of Systems  All other systems negative except as documented in the HPI. All pertinent positives and negatives as reviewed in the HPI. ____________________________________________   PHYSICAL EXAM:  VITAL SIGNS: ED Triage Vitals  Enc Vitals Group     BP 02/12/18 2114 (!) 141/86     Pulse Rate 02/12/18 2114 (!) 56     Resp 02/12/18 2114 18     Temp 02/12/18 2114 98 F (36.7 C)     Temp Source 02/12/18 2114 Oral     SpO2 02/12/18 2114 98 %     Weight 02/12/18 2114 155 lb (70.3 kg)     Height 02/12/18 2114 5\' 8"  (1.727 m)    Constitutional: Alert and oriented. Well appearing and in no acute distress. Eyes: Conjunctivae are normal. PERRL. EOMI. Head: Atraumatic. GU: acCompanied by nurse Casimiro NeedleMichael.  Diffuse  erythematous area to scrotum and base of penis. Nose: No congestion/rhinnorhea. Mouth/Throat: Mucous membranes are moist.  Oropharynx non-erythematous. Neck: No stridor.  No meningeal signs.   Cardiovascular: Normal rate, regular rhythm. Good peripheral circulation. Grossly normal heart sounds.   Respiratory: Normal respiratory effort.  No retractions. Lungs CTAB. Gastrointestinal: Soft and nontender. No distention.  Musculoskeletal: No lower extremity tenderness nor edema. No gross deformities of extremities. Neurologic:  Normal speech and language. No gross focal neurologic deficits are appreciated.  Skin:  Skin is warm, dry and intact. No rash noted.   ____________________________________________   LABS (all labs ordered are listed, but only abnormal results are displayed)  Labs Reviewed  GC/CHLAMYDIA PROBE AMP ([place]) NOT AT [facility]   ____________________________________________    INITIAL IMPRESSION / ASSESSMENT AND PLAN / ED COURSE  Rash and redness likely 2/2 irritation rather than from STD. Will check per patient request though.      Pertinent labs & imaging results that were available during my care of the patient were reviewed by me and considered in my medical decision making (see chart for details).  ____________________________________________  FINAL CLINICAL IMPRESSION(S) / ED DIAGNOSES  Final diagnoses:  Rash     MEDICATIONS GIVEN DURING THIS VISIT:  Medications - No data to display   NEW OUTPATIENT MEDICATIONS STARTED DURING THIS VISIT:  New Prescriptions   HYDROCORTISONE CREAM  0.5 %    Apply 1 application topically 4 (four) times daily.    Note:  This note was prepared with assistance of Dragon voice recognition software. Occasional wrong-word or sound-a-like substitutions may have occurred due to the inherent limitations of voice recognition software.   Lazer Wollard, Barbara Cower, MD 02/13/18 [phone]

## 2018-02-12 NOTE — ED Triage Notes (Signed)
Pt requests STD ck, reports he has had sex with someone over the weekend with someone he does not know, reports itching and redness to penis x a couple of weeks

## 2018-02-12 NOTE — ED Notes (Signed)
Pt ambulatory to waiting room. Pt verbalized understanding of discharge instructions.   

## 2018-02-16 LAB — GC/CHLAMYDIA PROBE AMP (~~LOC~~) NOT AT ARMC
CHLAMYDIA, DNA PROBE: NEGATIVE
Neisseria Gonorrhea: NEGATIVE

## 2018-09-28 ENCOUNTER — Other Ambulatory Visit: Payer: Self-pay

## 2018-09-28 DIAGNOSIS — L237 Allergic contact dermatitis due to plants, except food: Secondary | ICD-10-CM | POA: Insufficient documentation

## 2018-09-28 DIAGNOSIS — F1210 Cannabis abuse, uncomplicated: Secondary | ICD-10-CM | POA: Insufficient documentation

## 2018-09-28 DIAGNOSIS — J069 Acute upper respiratory infection, unspecified: Secondary | ICD-10-CM | POA: Insufficient documentation

## 2018-09-28 DIAGNOSIS — B356 Tinea cruris: Secondary | ICD-10-CM | POA: Insufficient documentation

## 2018-09-29 ENCOUNTER — Other Ambulatory Visit: Payer: Self-pay

## 2018-09-29 ENCOUNTER — Emergency Department (HOSPITAL_COMMUNITY)
Admission: EM | Admit: 2018-09-29 | Discharge: 2018-09-29 | Disposition: A | Discharge status: Home / Self Care | Payer: Self-pay | Attending: Emergency Medicine | Admitting: Emergency Medicine

## 2018-09-29 ENCOUNTER — Encounter (HOSPITAL_COMMUNITY): Payer: Self-pay | Admitting: Emergency Medicine

## 2018-09-29 DIAGNOSIS — L237 Allergic contact dermatitis due to plants, except food: Secondary | ICD-10-CM

## 2018-09-29 DIAGNOSIS — J069 Acute upper respiratory infection, unspecified: Secondary | ICD-10-CM

## 2018-09-29 DIAGNOSIS — B356 Tinea cruris: Secondary | ICD-10-CM

## 2018-09-29 MED ORDER — FLUCONAZOLE 200 MG PO TABS: 200.0000 mg | ORAL_TABLET | Freq: Every day | ORAL | 0 refills | Status: AC

## 2018-09-29 MED ORDER — FLUCONAZOLE 200 MG PO TABS: 200.0000 mg | ORAL_TABLET | Freq: Every day | ORAL | 0 refills | Status: DC

## 2018-09-29 MED ORDER — CLOTRIMAZOLE-BETAMETHASONE 1-0.05 % EX CREA: TOPICAL_CREAM | CUTANEOUS | 0 refills | Status: AC

## 2018-09-29 MED ORDER — DEXAMETHASONE SODIUM PHOSPHATE 10 MG/ML IJ SOLN
10.0000 mg | Freq: Once | INTRAMUSCULAR | Status: AC
  Administered 2018-09-29: 10 mg via INTRAMUSCULAR
  Filled 2018-09-29: qty 1

## 2018-09-29 NOTE — ED Triage Notes (Signed)
Pt here with multiple complaints. Here for poison oak on ankles, "crotch itch" for 1 month, and a sore throat x3 days.

## 2018-09-29 NOTE — ED Provider Notes (Signed)
[facility] EMERGENCY DEPARTMENT Provider Note   CSN: [id number] Arrival date & time: 09/28/18  2349     History   Chief Complaint Chief Complaint  Patient presents with  . Multiple Complaints    HPI Nicholas Clayton is a 23 y.o. male.     Patient presents with multiple complaints.  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  He is experiencing severe itching.  He has been using over-the-counter medications without any improvement.  Patient also complains of jock itch.  This is been ongoing for more than a month.  He has been using barrier creams and over-the-counter antifungals.  He reports that it improves when he does this, but then comes back.  Patient also has had a slight nonproductive cough and itching/sore throat for the last 2 days.  No fever.  No known COVID contact.     History reviewed. No pertinent past medical history.  Patient Active Problem List   Diagnosis Date Noted  . Major depressive disorder, single episode, severe without psychosis ([place]) 08/12/2017  . Suicidal ideation 08/12/2017    History reviewed. No pertinent surgical history.      Home Medications    Prior to Admission medications   Medication Sig Start Date End Date Taking? Authorizing Provider  clotrimazole-betamethasone (LOTRISONE) cream Apply to rash on groin area 2 times daily prn 09/29/18   Orpah Greek, MD  fluconazole (DIFLUCAN) 200 MG tablet Take 1 tablet (200 mg total) by mouth daily. 09/29/18   Orpah Greek, MD  hydrocortisone cream 0.5 % Apply 1 application topically 4 (four) times daily. 02/12/18   Mesner, Corene Cornea, MD    Family History No family history on file.  Social History Social History   Tobacco Use  . Smoking status: Never Smoker  . Smokeless tobacco: Never Used  Substance Use Topics  . Alcohol use: Yes    Comment: socially   . 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   Skin: Positive for rash.  All other systems reviewed and are negative.    Physical Exam Updated Vital Signs BP 137/88 (BP Location: Right Arm)   Pulse 88   Temp 98.2 F (36.8 C) (Oral)   Resp 16   Ht 5\' 9"  (1.753 m)   Wt 65.8 kg   SpO2 100%   BMI 21.41 kg/m   Physical Exam Vitals signs and nursing note reviewed.  Constitutional:      General: He is not in acute distress.    Appearance: Normal appearance. He is well-developed.  HENT:     Head: Normocephalic and atraumatic.     Right Ear: Hearing normal.     Left Ear: Hearing normal.     Nose: Nose normal.  Eyes:     Conjunctiva/sclera: Conjunctivae normal.     Pupils: Pupils are equal, round, and reactive to light.  Neck:     Musculoskeletal: Normal range of motion and neck supple.  Cardiovascular:     Rate and Rhythm: Regular rhythm.     Heart sounds: S1 normal and S2 normal. No murmur. No friction rub. No gallop.   Pulmonary:     Effort: Pulmonary effort is normal. No respiratory distress.     Breath sounds: Normal breath sounds.  Chest:  Chest wall: No tenderness.  Abdominal:     General: Bowel sounds are normal.     Palpations: Abdomen is soft.     Tenderness: There is no abdominal tenderness. There is no guarding or rebound. Negative signs include Murphy's sign and McBurney's sign.     Hernia: No hernia is present.  Genitourinary:    Penis: Normal.      Comments: Slight erythema and excoriations of scrotum and inguinal area Musculoskeletal: Normal range of motion.  Skin:    General: Skin is warm and dry.     Findings: Rash (Vesicular and papular rash on ankles and lower legs bilaterally) present.  Neurological:     Mental Status: He is alert and oriented to person, place, and time.     GCS: GCS eye subscore is 4. GCS verbal subscore is 5. GCS motor subscore is 6.     Cranial Nerves: No  cranial nerve deficit.     Sensory: No sensory deficit.     Coordination: Coordination normal.  Psychiatric:        Speech: Speech normal.        Behavior: Behavior normal.        Thought Content: Thought content normal.      ED Treatments / Results  Labs (all labs ordered are listed, but only abnormal results are displayed) Labs Reviewed - No data to display  EKG None  Radiology No results found.  Procedures Procedures (including critical care time)  Medications Ordered in ED Medications  dexamethasone (DECADRON) injection 10 mg (has no administration in time range)     Initial Impression / Assessment and Plan / ED Course  I have reviewed the triage vital signs and the nursing notes.  Pertinent labs & imaging results that were available during my care of the patient were reviewed by me and considered in my medical decision making (see chart for details).        Genital rash consistent with tinea.  Rash on legs consistent with contact dermatitis.  Oropharyngeal exam normal, lungs normal, no fever.  Final Clinical Impressions(s) / ED Diagnoses   Final diagnoses:  Jock itch  Allergic contact dermatitis due to plants, except food  Upper respiratory tract infection, unspecified type    ED Discharge Orders         Ordered    fluconazole (DIFLUCAN) 200 MG tablet  Daily,   Status:  Discontinued     09/29/18 0117    clotrimazole-betamethasone (LOTRISONE) cream     09/29/18 0117    fluconazole (DIFLUCAN) 200 MG tablet  Daily     09/29/18 0118           Gilda CreasePollina, Conchetta Lamia J, MD 09/29/18 [phone]

## 2019-11-04 ENCOUNTER — Encounter (HOSPITAL_COMMUNITY): Payer: Self-pay | Admitting: *Deleted

## 2019-11-04 ENCOUNTER — Other Ambulatory Visit: Payer: Self-pay

## 2019-11-04 ENCOUNTER — Emergency Department (HOSPITAL_COMMUNITY)
Admission: EM | Admit: 2019-11-04 | Discharge: 2019-11-04 | Disposition: A | Discharge status: Home / Self Care | Payer: Self-pay | Attending: Emergency Medicine | Admitting: Emergency Medicine

## 2019-11-04 DIAGNOSIS — L255 Unspecified contact dermatitis due to plants, except food: Secondary | ICD-10-CM | POA: Insufficient documentation

## 2019-11-04 MED ORDER — PREDNISONE 20 MG PO TABS: 40.0000 mg | ORAL_TABLET | Freq: Every day | ORAL | 0 refills | Status: DC

## 2019-11-04 MED ORDER — DEXAMETHASONE SODIUM PHOSPHATE 10 MG/ML IJ SOLN
10.0000 mg | Freq: Once | INTRAMUSCULAR | Status: AC
  Administered 2019-11-04: 10 mg via INTRAMUSCULAR
  Filled 2019-11-04: qty 1

## 2019-11-04 NOTE — Discharge Instructions (Addendum)
Your rash is consistent with poison oak or poison ivy.  Continue taking Benadryl, 1 capsule every 4-6 hours as needed for itching.  Avoid sweating or taking hot showers or baths.  You may also apply a small amount of over-the-counter 1% hydrocortisone cream to the affected areas twice daily.  Start taking the prednisone prescription within 1 to 2 days if the rash is not clearing.

## 2019-11-04 NOTE — ED Triage Notes (Signed)
Pt working outside now, may have contact with poison ivy.  Rash noted to left forearm and between fingers for past few days. Also spot to corner of right eye.

## 2019-11-06 NOTE — ED Provider Notes (Signed)
[facility] EMERGENCY DEPARTMENT Provider Note   CSN: [id number] Arrival date & time: 11/04/19  1846     History Chief Complaint  Patient presents with  . Rash    Nicholas Clayton is a 24 y.o. male.  HPI      Nicholas Clayton is a 24 y.o. male who presents to the Emergency Department complaining of itching and rash to neck, both forearms and right face.  States that he was exposed to poison ivy while cutting up brush.  Has has tried using Calamine lotion without relief.  Symptoms have been present for several days.  He denies visual changes, swelling of the face, lips or tongue.  No shortness of breath   History reviewed. No pertinent past medical history.  Patient Active Problem List   Diagnosis Date Noted  . Major depressive disorder, single episode, severe without psychosis ([facility]) 08/12/2017  . Suicidal ideation 08/12/2017    History reviewed. No pertinent surgical history.     History reviewed. No pertinent family history.  Social History   Tobacco Use  . Smoking status: Never Smoker  . Smokeless tobacco: Never Used  Vaping Use  . Vaping Use: Never used  Substance Use Topics  . Alcohol use: Yes    Comment: socially   . Drug use: Not Currently    Types: Methamphetamines, Marijuana    Home Medications Prior to Admission medications   Medication Sig Start Date End Date Taking? Authorizing Provider  clotrimazole-betamethasone (LOTRISONE) cream Apply to rash on groin area 2 times daily prn 09/29/18   Gilda Crease, MD  fluconazole (DIFLUCAN) 200 MG tablet Take 1 tablet (200 mg total) by mouth daily. 09/29/18   Gilda Crease, MD  hydrocortisone cream 0.5 % Apply 1 application topically 4 (four) times daily. 02/12/18   Mesner, Barbara Cower, MD  predniSONE (DELTASONE) 20 MG tablet Take 2 tablets (40 mg total) by mouth daily. 11/04/19   Zan Orlick, PA-C    Allergies    Aspirin  Review of Systems   Review of Systems  Constitutional: Negative for  activity change, appetite change, chills and fever.  HENT: Negative for facial swelling and trouble swallowing.   Eyes: Negative for pain, redness and visual disturbance.  Respiratory: Negative for chest tightness, shortness of breath and wheezing.   Musculoskeletal: Negative for neck pain and neck stiffness.  Skin: Positive for rash. Negative for wound.  Neurological: Negative for dizziness, weakness, numbness and headaches.    Physical Exam Updated Vital Signs BP 128/70 (BP Location: Right Arm)   Pulse 78   Temp 98.3 F (36.8 C) (Oral)   Resp 18   Ht 5\' 9"  (1.753 m)   Wt 68.9 kg   SpO2 99%   BMI 22.45 kg/m   Physical Exam Vitals and nursing note reviewed.  Constitutional:      General: He is not in acute distress.    Appearance: Normal appearance. He is well-developed.  HENT:     Mouth/Throat:     Mouth: Mucous membranes are moist.     Pharynx: Oropharynx is clear. No posterior oropharyngeal erythema.     Comments: No oral lesions Eyes:     Extraocular Movements: Extraocular movements intact.     Conjunctiva/sclera: Conjunctivae normal.     Pupils: Pupils are equal, round, and reactive to light.     Comments: No edema or erythema of the bilateral upper lids.    Cardiovascular:     Rate and Rhythm: Normal rate and regular rhythm.  Heart sounds: Normal heart sounds. No murmur heard.   Pulmonary:     Effort: Pulmonary effort is normal. No respiratory distress.     Breath sounds: Normal breath sounds. No wheezing.  Musculoskeletal:        General: No tenderness. Normal range of motion.     Cervical back: Normal range of motion and neck supple.  Lymphadenopathy:     Cervical: No cervical adenopathy.  Skin:    General: Skin is warm.     Capillary Refill: Capillary refill takes less than 2 seconds.     Findings: Erythema and rash present.     Comments: Erythematous pin point vesicles grouped in linear pattern to bilateral forearms.  Few scattered papules of right  neck and right face.  No edema.  Neurological:     General: No focal deficit present.     Mental Status: He is alert.     Motor: No weakness or abnormal muscle tone.     ED Results / Procedures / Treatments   Labs (all labs ordered are listed, but only abnormal results are displayed) Labs Reviewed - No data to display  EKG None  Radiology No results found.  Procedures Procedures (including critical care time)  Medications Ordered in ED Medications  dexamethasone (DECADRON) injection 10 mg (10 mg Intramuscular Given 11/04/19 2043)    ED Course  I have reviewed the triage vital signs and the nursing notes.  Pertinent labs & imaging results that were available during my care of the patient were reviewed by me and considered in my medical decision making (see chart for details).    MDM Rules/Calculators/A&P                          Pt with sx's c/w plant dermatitis.  No edema of the face.  No airway compromise.    Will tx with steroid.  Will continue benadryl   Final Clinical Impression(s) / ED Diagnoses Final diagnoses:  Plant dermatitis    Rx / DC Orders ED Discharge Orders         Ordered    predniSONE (DELTASONE) 20 MG tablet  Daily        11/04/19 2023           Pauline Aus, PA-C 11/06/19 1336    Bethann Berkshire, MD 11/16/19 1538

## 2022-01-23 ENCOUNTER — Ambulatory Visit (INDEPENDENT_AMBULATORY_CARE_PROVIDER_SITE_OTHER): Payer: Self-pay | Admitting: Urology

## 2022-01-23 VITALS — BP 116/71 | HR 85

## 2022-01-23 DIAGNOSIS — Z3009 Encounter for other general counseling and advice on contraception: Secondary | ICD-10-CM

## 2022-01-23 MED ORDER — DIAZEPAM 10 MG PO TABS: 10.0000 mg | ORAL_TABLET | Freq: Once | ORAL | 0 refills | Status: AC

## 2022-01-23 NOTE — Progress Notes (Signed)
   01/23/2022 3:26 PM   Nicholas Clayton 1995/11/17 [id number]  Referring provider: No referring provider defined for this encounter.  Desires sterilization   HPI: [name] is a 27yo here for evaluation of vasectomy. No scrotal surgeries. 1 healthy child.    PMH: No past medical history on file.  Surgical History: No past surgical history on file.  Home Medications:  Allergies as of 01/23/2022       Reactions   Aspirin Itching        Medication List        Accurate as of January 23, 2022  3:26 PM. If you have any questions, ask your nurse or doctor.          clotrimazole-betamethasone cream Commonly known as: LOTRISONE Apply to rash on groin area 2 times daily prn   fluconazole 200 MG tablet Commonly known as: DIFLUCAN Take 1 tablet (200 mg total) by mouth daily.   hydrocortisone cream 0.5 % Apply 1 application topically 4 (four) times daily.   predniSONE 20 MG tablet Commonly known as: DELTASONE Take 2 tablets (40 mg total) by mouth daily.        Allergies:  Allergies  Allergen Reactions   Aspirin Itching    Family History: No family history on file.  Social History:  reports that he has never smoked. He has never used smokeless tobacco. He reports current alcohol use. He reports that he does not currently use drugs after having used the following drugs: Methamphetamines and Marijuana.  ROS: All other review of systems were reviewed and are negative except what is noted above in HPI  Physical Exam: BP 116/71   Pulse 85   Constitutional:  Alert and oriented, No acute distress. HEENT: [place] AT, moist mucus membranes.  Trachea midline, no masses. Cardiovascular: No clubbing, cyanosis, or edema. Respiratory: Normal respiratory effort, no increased work of breathing. GI: Abdomen is soft, nontender, nondistended, no abdominal masses GU: No CVA tenderness. Circumcised phallus. No masses/lesions on penis, testis, scrotum. Bilateral vas deferns  palpable Lymph: No cervical or inguinal lymphadenopathy. Skin: No rashes, bruises or suspicious lesions. Neurologic: Grossly intact, no focal deficits, moving all 4 extremities. Psychiatric: Normal mood and affect.  Laboratory Data: Lab Results  Component Value Date   WBC 9.4 08/10/2017   HGB 17.4 (H) 08/10/2017   HCT 49.1 08/10/2017   MCV 84.8 08/10/2017   PLT 277 08/10/2017    Lab Results  Component Value Date   CREATININE 0.98 08/10/2017    No results found for: "PSA"  No results found for: "TESTOSTERONE"  Lab Results  Component Value Date   HGBA1C 4.8 08/13/2017    Urinalysis No results found for: "COLORURINE", "APPEARANCEUR", "LABSPEC", "PHURINE", "GLUCOSEU", "HGBUR", "BILIRUBINUR", "KETONESUR", "PROTEINUR", "UROBILINOGEN", "NITRITE", "LEUKOCYTESUR"  No results found for: "LABMICR", "WBCUA", "RBCUA", "LABEPIT", "MUCUS", "BACTERIA"  Pertinent Imaging:  No results found for this or any previous visit.  No results found for this or any previous visit.  No results found for this or any previous visit.  No results found for this or any previous visit.  No results found for this or any previous visit.  No valid procedures specified. No results found for this or any previous visit.  No results found for this or any previous visit.   Assessment & Plan:    1. Vasectomy evaluation Schedule for vasectomy. Rx for valium sent to pharmacy   No follow-ups on file.  Nicolette Bang, MD  [facility] [place]

## 2022-01-28 ENCOUNTER — Encounter: Payer: Self-pay | Admitting: Urology

## 2022-01-28 NOTE — Patient Instructions (Signed)
Vasectomy Vasectomy is a procedure in which the vas deferens is cut and then tied or burned (cauterized). The vas deferens is a tube that carries sperm from the testicle to the part of the body that drains urine from the bladder (urethra). This procedure blocks sperm from going through the vas deferens and penis during ejaculation. This ensures that sperm does not go into the vagina during sex. Vasectomy does not affect sexual desire or performance and does not prevent sexually transmitted infections. Vasectomy is considered a permanent and very effective form of birth control (contraception). The decision to have a vasectomy should not be made during a stressful time, such as after the loss of a pregnancy or a divorce. You and your partner should decide on whether to have a vasectomy when you are sure that you do not want children in the future. Tell a health care provider about: Any allergies you have. All medicines you are taking, including vitamins, herbs, eye drops, creams, and over-the-counter medicines. Any problems you or family members have had with anesthetic medicines. Any blood disorders you have. Any surgeries you have had. Any medical conditions you have. What are the risks? Generally, this is a safe procedure. However, problems may occur, including: Infection. Bleeding and swelling of the scrotum. The scrotum is the sac that contains the testicles, blood vessels, and structures that help deliver sperm and semen. Allergic reactions to medicines. Failure of the procedure to prevent pregnancy. There is a very small chance that the tied or cauterized ends of the vas deferens may reconnect (recanalization). If this happens, you could still make a woman pregnant. Pain in the scrotum that continues after you heal from the procedure. What happens before the procedure? Medicines Ask your health care provider about: Changing or stopping your regular medicines. This is especially important if  you are taking diabetes medicines or blood thinners. Taking medicines such as aspirin and ibuprofen. These medicines can thin your blood. Do not take these medicines unless your health care provider tells you to take them. Taking over-the-counter medicines, vitamins, herbs, and supplements. You may be told to take a medicine to help you relax (sedative) a few hours before the procedure. General instructions Do not use any products that contain nicotine or tobacco for at least 4 weeks before the procedure. These products include cigarettes, e-cigarettes, and chewing tobacco. If you need help quitting, ask your health care provider. Plan to have a responsible adult take you home from the hospital or clinic. If you will be going home right after the procedure, plan to have a responsible adult care for you for the time you are told. This is important. Ask your health care provider: How your surgery site will be marked. What steps will be taken to help prevent infection. These steps may include: Removing hair at the surgery site. Washing skin with a germ-killing soap. Taking antibiotic medicine. What happens during the procedure?  You will be given one or more of the following: A sedative, unless you were told to take this a few hours before the procedure. A medicine to numb the area (local anesthetic). Your health care provider will feel, or palpate, for your vas deferens. To reach the vas deferens, one of two methods may be used: A very small incision may be made in your scrotum. A punctured opening may be made in your scrotum, without an incision. Your vas deferens will be pulled out of your scrotum and cut. Then, the vas deferens will be closed   in one of two ways: Tied at the ends. Cauterized at the ends to seal them off. The vas deferens will be put back into your scrotum. The incision or puncture opening will be closed with absorbable stitches (sutures). The sutures will eventually  dissolve and will not need to be removed after the procedure. The procedure will be repeated on the other side of your scrotum. The procedure may vary among health care providers and hospitals. What happens after the procedure? You will be monitored to make sure that you do not have problems. You will be asked not to ejaculate for at least 1 week after the procedure, or for as long as you are told. You will need to use a different form of contraception for 2-4 months after the procedure, until you have test results confirming that there are no sperm in your semen. You may be given scrotal support to wear, such as a jockstrap or underwear with a supportive pouch. If you were given a sedative during the procedure, it can affect you for several hours. Do not drive or operate machinery until your health care provider says that it is safe. Summary Vasectomy blocks sperm from being released during ejaculation. This procedure is considered a permanent and very effective form of birth control. Your scrotum will be numbed with medicine (local anesthetic) for the procedure. After the procedure, you will be asked not to ejaculate for at least 1 week, or for as long as you are told. You will also need to use a different form of contraception until your test results confirm that there are no sperm in your semen. This information is not intended to replace advice given to you by your health care provider. Make sure you discuss any questions you have with your health care provider. Document Revised: 05/20/2019 Document Reviewed: 05/20/2019 Elsevier Patient Education  2023 [employer] Inc.  

## 2022-02-15 ENCOUNTER — Ambulatory Visit (INDEPENDENT_AMBULATORY_CARE_PROVIDER_SITE_OTHER): Payer: Self-pay | Admitting: Urology

## 2022-02-15 ENCOUNTER — Encounter: Payer: Self-pay | Admitting: Urology

## 2022-02-15 VITALS — BP 114/67 | HR 78

## 2022-02-15 DIAGNOSIS — Z3009 Encounter for other general counseling and advice on contraception: Secondary | ICD-10-CM

## 2022-02-15 DIAGNOSIS — Z9852 Vasectomy status: Secondary | ICD-10-CM

## 2022-02-15 NOTE — Progress Notes (Signed)
02/15/22  CC: desires sterilization   HPI: [name] is a 27yo here for vasectomy  Blood pressure 114/67, pulse 78. NED. A&Ox3.   No respiratory distress   Abd soft, NT, ND Normal external genitalia with patent urethral meatus  A timeout was performed.  Patient's identity and consent was confirmed.  All questions were answered.   Bilateral Vasectomy Procedure  Pre-Procedure: - Patient's scrotum was prepped and draped for vasectomy. - The vas was palpated through the scrotal skin on the left. - 1% Xylocaine was injected into the skin and surrounding tissue for placement  - In a similar manner, the vas on the right was identified, anesthetized, and stabilized.  Procedure: - A sharp hemostat was used to make a small stab incision in the skin overlying the vas - The left vas was isolated and brought up through the incision exposing that structure. - Bleeding points were cauterized as they occurred. - The vas was free from the surrounding structures and brought to the view. - A segment was positioned for placement with a hemostat. - A second hemostat was placed and a small segment between the two hemostats and was removed for inspection. - Each end of the transected vas lumen was fulgurated/ obliterated using needlepoint electrocautery -A fascial interposition was performed on testicular end of the vas using #3-0 chromic suture -The same procedure was performed on the right. - A single suture of #3-0 chromic catgut was used to close each lateral scrotal skin incision - A dressing was applied.  Post-Procedure: - Patient was instructed in care of the operative area - A specimen is to be delivered in 12 weeks   -Another form of contraception is to be used until post vasectomy semen analysis  Nicolette Bang, MD

## 2022-02-15 NOTE — Patient Instructions (Signed)
Vasectomy Postoperative Instructions ? ?Please bring back a semen analysis in approximately 3 months.  ?Your semen analysis  will need to be taken to  ?[facility]  [address], [place], [place] [address] ?[phone] ? ? You will be given a sterile specimen cup. Please label the cup with your name, date of birth, date and time of collections.  ?What to Expect ? - slight redness, swelling and scant drainage along the incision ? - mild to moderate discomfort ? - black and blue (bruising) as the tissue heals ? - low grade fever ? - scrotal sensitivity and/or tenderness ?- Edges of the incision may pull apart and heal slowly, sometimes a knot may be present which remains for several months.  This is NORMAL and all part of the healing process. ?- if stitches are placed, they do not need to be removed ?- if you have pain or discomfort immediately after the vasectomy, you may use OTC pain medication for relief , ex: tylenol.  After local anesthetic wears off an ice pack will provide additional comfort and can also prevent swelling if used ? ?Activity ? - no sexual intercourse for at lease 5 days depending on comfort ? - no heavy lifting for 48-72 hours (anything over 5-10 lbs) ? ?Wound Care ? - shower only after 24 hours ? - no tub baths, hot tub, or pools for at least 7 days ? - ice packs for 48 hours: 30 minutes on and 30 minutes off ? ?Problem to Report ? - generalized redness ? - increased pain and swelling ? - fever greater than 101 F ? - significant drainage or bleeding from the wound ? ?TO DO ?- Ejaculations help to clear the passage of sperm, but you must use another from of birth control until you are told you may discontinue its use!! ?- You will be given a specimen cup to bring back a semen sample in 3 months to check and see if its clear of sperm.  Only after the semen is sent for analysis and is reported back as clear should you use this as your primary form of birth control!    ?

## 2022-03-01 ENCOUNTER — Telehealth: Payer: Self-pay

## 2022-03-01 NOTE — Telephone Encounter (Signed)
Return call to patient. Patient states that he is experiencing pain in his pelvic area 2 weeks post- op. Patient states that in his second week he was lifting and then afterwards he felt pains for about 3 days later.  Review with Dr. Alyson Ingles about 2 week post-op pain in pelvic area and made pain are that it is nerve pain and it can last about 2-3 weeks and should subsided. Made patient aware that he should not do any lifting over 10 lbs for the need few weeks and his pain should subsided and if not he should call our office back. Patient voiced understanding

## 2022-05-10 ENCOUNTER — Telehealth: Payer: Self-pay

## 2022-05-10 NOTE — Telephone Encounter (Signed)
Patient called on 4/26 and left vm with questions regarding a Vasectomy Reversal. He had Vasectomy performed on 02/17/2022. He wanted to know if it was too soon for a reversal and what the steps would be to get one.

## 2022-11-24 ENCOUNTER — Encounter (HOSPITAL_COMMUNITY): Payer: Self-pay

## 2022-11-24 ENCOUNTER — Other Ambulatory Visit: Payer: Self-pay

## 2022-11-24 ENCOUNTER — Emergency Department (HOSPITAL_COMMUNITY)
Admission: EM | Admit: 2022-11-24 | Discharge: 2022-11-24 | Disposition: A | Discharge status: Home / Self Care | Payer: Self-pay | Attending: Emergency Medicine | Admitting: Emergency Medicine

## 2022-11-24 DIAGNOSIS — M7022 Olecranon bursitis, left elbow: Secondary | ICD-10-CM | POA: Insufficient documentation

## 2022-11-24 DIAGNOSIS — Y9389 Activity, other specified: Secondary | ICD-10-CM | POA: Insufficient documentation

## 2022-11-24 MED ORDER — CEPHALEXIN 500 MG PO CAPS: 500.0000 mg | ORAL_CAPSULE | Freq: Three times a day (TID) | ORAL | 0 refills | Status: AC

## 2022-11-24 NOTE — Discharge Instructions (Signed)
If your elbow becomes more red swollen hot or if you develop fever start taking the cephalexin otherwise this may take several months to get better, this is a very common chronic condition, avoid bumping her elbow, see your doctor if no better within a couple of weeks, see the list below if you do not have a local family doctor  [facility] Doctor List    Syliva Overman, MD. Specialty: Family Medicine Contact information: [address]  [place] [address]  [phone]   Lilyan Punt, MD. Specialty: Family Medicine Contact information: [address]  [place] [address]  [phone]   Avon Gully, MD Specialty: Internal Medicine Contact information: [address] [place] [address]  [phone]   Catalina Pizza, MD. Specialty: Internal Medicine Contact information: [address]  [place] [address]  [phone]    [facility] (Dr. Selena Batten) Specialty: Family Medicine Contact information: [address]  [place] [address]  [phone]   John Giovanni, MD. Specialty: Family Medicine Contact information: [address]  [address]  [place] [address]  [phone]   Carylon Perches, MD. Specialty: Internal Medicine Contact information: [address]  [address]  [place] [address]  [phone]   [facility]: [address] [phone]  [place], Family medicine [address]  [phone]  [facility] [address] [place], [place] [phone]  [facility]: [address] [phone]    [facility]  [address] [place], [place] [address] [phone]  Services [facility] offers a variety of basic health services.  Services include but are not limited to: Blood pressure checks  Heart rate checks  Blood sugar checks  Urine analysis  Rapid strep tests   Pregnancy tests.  Health education and referrals  People needing more complex services will be directed to a physician online. Using these virtual visits, doctors can evaluate and prescribe medicine and treatments. There will be no medication on-site, though [facility] will help patients fill their prescriptions at little to no cost.   For More information please go to: [email or web address]  Allergy and Asthma:    [address] [facility] [address] [facility] [phone]  Urology:  [address]  [place] [phone]  [facility]  [address] [place], [place] [phone]  Orthopedics   [address] [place], [place] [phone]  Endocrinology  [address] [place], [place] [phone]  Podiatry: [facility] [phone]

## 2022-11-24 NOTE — ED Provider Notes (Signed)
[place] EMERGENCY DEPARTMENT AT [facility] Provider Note   CSN: [id number] Arrival date & time: 11/24/22  1951     History  Chief Complaint  Patient presents with   Second Opinion     Nicholas Clayton is a 27 y.o. male.  HPI   This patient is a 27 year old male presenting with a complaint of small bumps on his upper lip after having a new sexual contact, also having a feeling of his left elbow being swollen which has been going on for about a week, he has a history of remote olecranon bursitis which seem to get better although it took quite some time.  He denies fevers or chills, no nausea or vomiting, he is already been seen at an outside hospital and I have reviewed those records showing a negative CT scan of the elbow except for bursal swelling, negative CBC CRP and sed rate, he had negative gonorrhea and Chlamydia testing, he has no penile symptoms just the bumps on his upper lip  Home Medications Prior to Admission medications   Medication Sig Start Date End Date Taking? Authorizing Provider  cephALEXin (KEFLEX) 500 MG capsule Take 1 capsule (500 mg total) by mouth 3 (three) times daily for 7 days. 11/24/22 12/01/22 Yes Eber Hong, MD  clotrimazole-betamethasone (LOTRISONE) cream Apply to rash on groin area 2 times daily prn Patient not taking: Reported on 01/23/2022 09/29/18   Gilda Crease, MD  fluconazole (DIFLUCAN) 200 MG tablet Take 1 tablet (200 mg total) by mouth daily. Patient not taking: Reported on 01/23/2022 09/29/18   Gilda Crease, MD  hydrocortisone cream 0.5 % Apply 1 application topically 4 (four) times daily. Patient not taking: Reported on 01/23/2022 02/12/18   Mesner, Barbara Cower, MD  predniSONE (DELTASONE) 20 MG tablet Take 2 tablets (40 mg total) by mouth daily. Patient not taking: Reported on 01/23/2022 11/04/19   Pauline Aus, PA-C      Allergies    Iodinated contrast media and Aspirin    Review of Systems   Review of Systems   All other systems reviewed and are negative.   Physical Exam Updated Vital Signs BP (!) 151/79   Pulse 72   Temp 98.7 F (37.1 C) (Oral)   Resp 17   Ht 1.753 m (5\' 9" )   Wt 72.6 kg   SpO2 100%   BMI 23.63 kg/m  Physical Exam Vitals and nursing note reviewed.  Constitutional:      General: He is not in acute distress.    Appearance: He is well-developed.  HENT:     Head: Normocephalic and atraumatic.     Mouth/Throat:     Pharynx: No oropharyngeal exudate.     Comments: No obvious lesions on the upper or lower lip, intraoral cavity is totally clear Eyes:     General: No scleral icterus.       Right eye: No discharge.        Left eye: No discharge.     Conjunctiva/sclera: Conjunctivae normal.     Pupils: Pupils are equal, round, and reactive to light.  Neck:     Thyroid: No thyromegaly.     Vascular: No JVD.  Cardiovascular:     Rate and Rhythm: Normal rate and regular rhythm.     Heart sounds: Normal heart sounds. No murmur heard.    No friction rub. No gallop.  Pulmonary:     Effort: Pulmonary effort is normal. No respiratory distress.     Breath sounds:  Normal breath sounds. No wheezing or rales.  Abdominal:     General: Bowel sounds are normal. There is no distension.     Palpations: Abdomen is soft. There is no mass.     Tenderness: There is no abdominal tenderness.  Musculoskeletal:        General: Tenderness present. Normal range of motion.     Cervical back: Normal range of motion and neck supple.     Right lower leg: No edema.     Left lower leg: No edema.     Comments: Normal range of motion of the left upper extremity in its entirety including pronation supination flexion and extension though there is tenderness with palpation over the olecranon.  There does appear to be bursitis, there is no redness or induration, there is mild warmth  Lymphadenopathy:     Cervical: No cervical adenopathy.  Skin:    General: Skin is warm and dry.     Findings: No  erythema or rash.  Neurological:     Mental Status: He is alert.     Coordination: Coordination normal.  Psychiatric:        Behavior: Behavior normal.     ED Results / Procedures / Treatments   Labs (all labs ordered are listed, but only abnormal results are displayed) Labs Reviewed - No data to display  EKG None  Radiology No results found.  Procedures Procedures    Medications Ordered in ED Medications - No data to display  ED Course/ Medical Decision Making/ A&P                                 Medical Decision Making Risk Prescription drug management.   Exam is not consistent with a septic joint, the workup that he had had several days ago was also consistent with a nonseptic joint, I suspect that he has an inflammatory bursitis.  He was given prednisone, meloxicam and can follow-up in the outpatient setting.  I did prescribe him a course of cephalexin just in case he gets worse but told not to take it unless he gets redness or fevers.  As to the concern of STD, I do not think any oral lesions are present that would be concerning for an STD, there is no signs of herpes, the patient is stable for discharge and can follow-up in the outpatient setting, given resource list        Final Clinical Impression(s) / ED Diagnoses Final diagnoses:  Olecranon bursitis of left elbow    Rx / DC Orders ED Discharge Orders          Ordered    cephALEXin (KEFLEX) 500 MG capsule  3 times daily        11/24/22 2014              Eber Hong, MD 11/24/22 2017

## 2022-11-24 NOTE — ED Notes (Signed)
ED Provider at bedside. 

## 2022-11-24 NOTE — ED Triage Notes (Signed)
Pt complains of white clusters on lip. Normal lip in triage  Sexual contact with a random stranger. Nicholas Clayton tested for STD's and was negative, pt requests additional testing. Denies any problems with genitals  Pt would also like his LEFT elbow looked at.  Elbow swelling since Sunday.  Pt received prednisone from [facility] on Monday. Nicholas Clayton completed XRAY and CT scan. Pt has an allergic reaction to IVP dye that day.   Requests a second opinion on elbow and lip

## 2023-06-15 ENCOUNTER — Encounter (HOSPITAL_COMMUNITY): Payer: Self-pay | Admitting: Emergency Medicine

## 2023-06-15 ENCOUNTER — Emergency Department (HOSPITAL_COMMUNITY)
Admission: EM | Admit: 2023-06-15 | Discharge: 2023-06-15 | Disposition: A | Discharge status: Against Medical Advice | Payer: Self-pay | Attending: Emergency Medicine | Admitting: Emergency Medicine

## 2023-06-15 ENCOUNTER — Other Ambulatory Visit: Payer: Self-pay

## 2023-06-15 DIAGNOSIS — M549 Dorsalgia, unspecified: Secondary | ICD-10-CM | POA: Insufficient documentation

## 2023-06-15 DIAGNOSIS — H9209 Otalgia, unspecified ear: Secondary | ICD-10-CM | POA: Insufficient documentation

## 2023-06-15 DIAGNOSIS — R070 Pain in throat: Secondary | ICD-10-CM | POA: Insufficient documentation

## 2023-06-15 DIAGNOSIS — Z5321 Procedure and treatment not carried out due to patient leaving prior to being seen by health care provider: Secondary | ICD-10-CM | POA: Insufficient documentation

## 2023-06-15 NOTE — ED Triage Notes (Signed)
 Pt states he has been having throat pain and sharp back pain. Was seen at [facility] yesterday and was diagnosed with ear pain and started on Amoxicillin. Pt states that tonight he started choking on his saliva and decided to come here for a "second opinion".

## 2023-06-20 ENCOUNTER — Other Ambulatory Visit: Payer: Self-pay

## 2023-06-20 ENCOUNTER — Encounter (HOSPITAL_COMMUNITY): Payer: Self-pay | Admitting: Emergency Medicine

## 2023-06-20 ENCOUNTER — Emergency Department (HOSPITAL_COMMUNITY)
Admission: EM | Admit: 2023-06-20 | Discharge: 2023-06-20 | Disposition: A | Discharge status: Home / Self Care | Payer: Self-pay | Attending: Emergency Medicine | Admitting: Emergency Medicine

## 2023-06-20 ENCOUNTER — Emergency Department (HOSPITAL_COMMUNITY): Payer: Self-pay

## 2023-06-20 DIAGNOSIS — H9202 Otalgia, left ear: Secondary | ICD-10-CM | POA: Insufficient documentation

## 2023-06-20 DIAGNOSIS — J039 Acute tonsillitis, unspecified: Secondary | ICD-10-CM | POA: Insufficient documentation

## 2023-06-20 DIAGNOSIS — R531 Weakness: Secondary | ICD-10-CM | POA: Insufficient documentation

## 2023-06-20 LAB — CBC WITH DIFFERENTIAL/PLATELET
Abs Immature Granulocytes: 0.13 10*3/uL — ABNORMAL HIGH (ref 0.00–0.07)
Basophils Absolute: 0.1 10*3/uL (ref 0.0–0.1)
Basophils Relative: 1 %
Eosinophils Absolute: 0.1 10*3/uL (ref 0.0–0.5)
Eosinophils Relative: 2 %
HCT: 44.3 % (ref 39.0–52.0)
Hemoglobin: 15.4 g/dL (ref 13.0–17.0)
Immature Granulocytes: 2 %
Lymphocytes Relative: 17 %
Lymphs Abs: 1.2 10*3/uL (ref 0.7–4.0)
MCH: 29.2 pg (ref 26.0–34.0)
MCHC: 34.8 g/dL (ref 30.0–36.0)
MCV: 83.9 fL (ref 80.0–100.0)
Monocytes Absolute: 0.9 10*3/uL (ref 0.1–1.0)
Monocytes Relative: 12 %
Neutro Abs: 5 10*3/uL (ref 1.7–7.7)
Neutrophils Relative %: 66 %
Platelets: 353 10*3/uL (ref 150–400)
RBC: 5.28 MIL/uL (ref 4.22–5.81)
RDW: 12.5 % (ref 11.5–15.5)
Smear Review: NORMAL
WBC: 7.4 10*3/uL (ref 4.0–10.5)
nRBC: 0 % (ref 0.0–0.2)

## 2023-06-20 LAB — BASIC METABOLIC PANEL WITH GFR
Anion gap: 13 (ref 5–15)
BUN: 14 mg/dL (ref 6–20)
CO2: 22 mmol/L (ref 22–32)
Calcium: 9 mg/dL (ref 8.9–10.3)
Chloride: 101 mmol/L (ref 98–111)
Creatinine, Ser: 1.02 mg/dL (ref 0.61–1.24)
GFR, Estimated: >60 mL/min (ref >60)
Glucose, Bld: 65 mg/dL — ABNORMAL LOW (ref 70–99)
Potassium: 3.8 mmol/L (ref 3.5–5.1)
Sodium: 136 mmol/L (ref 135–145)

## 2023-06-20 LAB — I-STAT CG4 LACTIC ACID, ED: Lactic Acid, Venous: 0.8 mmol/L (ref 0.5–1.9)

## 2023-06-20 MED ORDER — DEXAMETHASONE SODIUM PHOSPHATE 10 MG/ML IJ SOLN
16.0000 mg | Freq: Once | INTRAMUSCULAR | Status: AC
  Administered 2023-06-20: 16 mg via INTRAVENOUS
  Filled 2023-06-20: qty 2

## 2023-06-20 MED ORDER — AMOXICILLIN-POT CLAVULANATE 875-125 MG PO TABS: 1.0000 | ORAL_TABLET | Freq: Two times a day (BID) | ORAL | 0 refills | Status: AC

## 2023-06-20 MED ORDER — SODIUM CHLORIDE 0.9 % IV SOLN
3.0000 g | Freq: Once | INTRAVENOUS | Status: AC
  Administered 2023-06-20: 3 g via INTRAVENOUS
  Filled 2023-06-20: qty 8

## 2023-06-20 MED ORDER — MORPHINE SULFATE (PF) 4 MG/ML IV SOLN
4.0000 mg | Freq: Once | INTRAVENOUS | Status: AC
  Administered 2023-06-20: 4 mg via INTRAVENOUS
  Filled 2023-06-20: qty 1

## 2023-06-20 MED ORDER — PREDNISONE 10 MG PO TABS: 20.0000 mg | ORAL_TABLET | Freq: Every day | ORAL | 0 refills | Status: AC

## 2023-06-20 MED ORDER — DEXAMETHASONE 1 MG/ML PO CONC: 16.0000 mg | Freq: Once | ORAL | Status: DC

## 2023-06-20 MED ORDER — SODIUM CHLORIDE 0.9 % IV BOLUS
1000.0000 mL | Freq: Once | INTRAVENOUS | Status: AC
  Administered 2023-06-20: 1000 mL via INTRAVENOUS

## 2023-06-20 MED ORDER — KETOROLAC TROMETHAMINE 15 MG/ML IJ SOLN
15.0000 mg | Freq: Once | INTRAMUSCULAR | Status: AC
  Administered 2023-06-20: 15 mg via INTRAVENOUS
  Filled 2023-06-20: qty 1

## 2023-06-20 MED ORDER — ONDANSETRON HCL 4 MG/2ML IJ SOLN
4.0000 mg | Freq: Once | INTRAMUSCULAR | Status: AC
  Administered 2023-06-20: 4 mg via INTRAVENOUS
  Filled 2023-06-20: qty 2

## 2023-06-20 NOTE — ED Provider Notes (Addendum)
 [place] EMERGENCY DEPARTMENT AT [facility] Provider Note   CSN: [id number] Arrival date & time: 06/20/23  1230     History  Chief Complaint  Patient presents with   Sore Throat    Nicholas Clayton is a 28 y.o. male.  Patient is a 28 year old male diagnosed with tonsillitis at urgent care and started on oral antibiotics.  Patient was originally started on amoxicillin and completed 4/10 days. He went back to another urgent care for worsening symptoms and started on Clindamycin and completed 4/10 day prescription.   He is coming to the emergency department today for worsening pain, difficulty swallowing due to pain, myalgias, subjective fevers, and generalized weakness.  He states he is now starting to have left-sided ear pain.  The history is provided by the patient. No language interpreter was used.  Sore Throat Pertinent negatives include no chest pain, no abdominal pain and no shortness of breath.       Home Medications Prior to Admission medications   Medication Sig Start Date End Date Taking? Authorizing Provider  amoxicillin-clavulanate (AUGMENTIN) 875-125 MG tablet Take 1 tablet by mouth every 12 (twelve) hours for 10 days. 06/20/23 06/30/23 Yes Owen Blowers P, DO  predniSONE  (DELTASONE ) 10 MG tablet Take 2 tablets (20 mg total) by mouth daily for 5 days. 06/20/23 06/25/23 Yes Owen Blowers P, DO  clotrimazole -betamethasone  (LOTRISONE ) cream Apply to rash on groin area 2 times daily prn Patient not taking: Reported on 01/23/2022 09/29/18   Ballard Bongo, MD  fluconazole  (DIFLUCAN ) 200 MG tablet Take 1 tablet (200 mg total) by mouth daily. Patient not taking: Reported on 01/23/2022 09/29/18   Ballard Bongo, MD  hydrocortisone  cream 0.5 % Apply 1 application topically 4 (four) times daily. Patient not taking: Reported on 01/23/2022 02/12/18   Mesner, Reymundo Caulk, MD      Allergies    Iodinated contrast media and Aspirin    Review of Systems   Review of Systems   Constitutional:  Negative for chills and fever.  HENT:  Positive for ear pain, sore throat and trouble swallowing. Negative for drooling.   Eyes:  Negative for pain and visual disturbance.  Respiratory:  Negative for cough and shortness of breath.   Cardiovascular:  Negative for chest pain and palpitations.  Gastrointestinal:  Negative for abdominal pain and vomiting.  Genitourinary:  Negative for dysuria and hematuria.  Musculoskeletal:  Negative for arthralgias and back pain.  Skin:  Negative for color change and rash.  Neurological:  Negative for seizures and syncope.  All other systems reviewed and are negative.   Physical Exam Updated Vital Signs BP 105/61 (BP Location: Left Arm)   Pulse 95   Temp 98.7 F (37.1 C) (Oral)   Resp 18   Wt 71 kg   SpO2 100%   BMI 23.11 kg/m  Physical Exam Vitals and nursing note reviewed.  Constitutional:      General: He is not in acute distress.    Appearance: He is well-developed.  HENT:     Head: Normocephalic and atraumatic.     Right Ear: Hearing, tympanic membrane, ear canal and external ear normal.     Left Ear: Hearing, tympanic membrane, ear canal and external ear normal.     Mouth/Throat:     Lips: Pink.     Mouth: Mucous membranes are moist.     Tongue: No lesions. Tongue does not deviate from midline.     Palate: No mass and lesions.  Pharynx: Uvula midline. Posterior oropharyngeal erythema present. No uvula swelling.     Tonsils: Tonsillar exudate and tonsillar abscess present. 1+ on the right. 4+ on the left.  Eyes:     Conjunctiva/sclera: Conjunctivae normal.  Cardiovascular:     Rate and Rhythm: Normal rate and regular rhythm.     Heart sounds: No murmur heard. Pulmonary:     Effort: Pulmonary effort is normal. No respiratory distress.     Breath sounds: Normal breath sounds.  Abdominal:     Palpations: Abdomen is soft.     Tenderness: There is no abdominal tenderness.  Musculoskeletal:        General: No  swelling.     Cervical back: Neck supple.  Skin:    General: Skin is warm and dry.     Capillary Refill: Capillary refill takes less than 2 seconds.  Neurological:     Mental Status: He is alert.  Psychiatric:        Mood and Affect: Mood normal.     ED Results / Procedures / Treatments   Labs (all labs ordered are listed, but only abnormal results are displayed) Labs Reviewed  CBC WITH DIFFERENTIAL/PLATELET - Abnormal; Notable for the following components:      Result Value   Abs Immature Granulocytes 0.13 (*)    All other components within normal limits  BASIC METABOLIC PANEL WITH GFR - Abnormal; Notable for the following components:   Glucose, Bld 65 (*)    All other components within normal limits  CULTURE, BLOOD (ROUTINE X 2)  CULTURE, BLOOD (ROUTINE X 2)  I-STAT CG4 LACTIC ACID, ED  CBG MONITORING, ED    EKG None  Radiology CT Soft Tissue Neck Wo Contrast Result Date: 06/20/2023 CLINICAL DATA:  Tonsillitis, concern for sepsis, possible peritonsillar abscess. Pain extending to left ear. EXAM: CT NECK WITHOUT CONTRAST TECHNIQUE: Multidetector CT imaging of the neck was performed following the standard protocol without intravenous contrast. RADIATION DOSE REDUCTION: This exam was performed according to the departmental dose-optimization program which includes automated exposure control, adjustment of the mA and/or kV according to patient size and/or use of iterative reconstruction technique. COMPARISON:  None Available. FINDINGS: Pharynx and larynx: Edematous appearance of the pharyngeal mucosa. There is prominence of the bilateral palatine tonsils more so on the left. Limited evaluation for peritonsillar abscess given noncontrast technique. There is suggestion of edema in the left parapharyngeal space. The oropharynx appears mildly narrowed. The visualized oral cavity and floor of mouth is unremarkable. Normal appearance of the base of tongue and epiglottis. No retropharyngeal  effusion appreciated. Symmetric appearance of the aryepiglottic folds and piriform sinuses. Salivary glands: No inflammation, mass, or stone. Thyroid: Normal. Lymph nodes: Enlarged bilateral cervical lymph nodes particularly in level 2 measuring at least 1.1 cm in short axis. Vascular: Limited evaluation given lack of IV contrast. Limited intracranial: Negative. Visualized orbits: Visualized orbits are symmetric. Mastoids and visualized paranasal sinuses: Visualized paranasal sinuses are clear. Mastoid air cells are clear. Skeleton: No acute or aggressive process. Upper chest: Negative. Other: None. IMPRESSION: Examination is limited due to lack of IV contrast. Prominence of the palatine tonsils, left greater than right. Suggestion of edema within the left parapharyngeal space. Peritonsillar abscess cannot be excluded due to lack of contrast. Edematous appearance of the pharyngeal mucosa suggestive of pharyngitis. Enlarged cervical lymph nodes, likely reactive. Electronically Signed   By: Denny Flack M.D.   On: 06/20/2023 16:55    Procedures Procedures    Medications Ordered in  ED Medications  sodium chloride 0.9 % bolus 1,000 mL (0 mLs Intravenous Stopped 06/20/23 1550)  ketorolac (TORADOL) 15 MG/ML injection 15 mg (15 mg Intravenous Given 06/20/23 1436)  morphine (PF) 4 MG/ML injection 4 mg (4 mg Intravenous Given 06/20/23 1436)  ondansetron  (ZOFRAN ) injection 4 mg (4 mg Intravenous Given 06/20/23 1436)  sodium chloride 0.9 % bolus 1,000 mL (0 mLs Intravenous Stopped 06/20/23 1550)  Ampicillin-Sulbactam (UNASYN) 3 g in sodium chloride 0.9 % 100 mL IVPB (0 g Intravenous Stopped 06/20/23 1510)  dexamethasone  (DECADRON ) injection 16 mg (16 mg Intravenous Given 06/20/23 1820)    ED Course/ Medical Decision Making/ A&P Clinical Course as of 06/20/23 2102  Fri Jun 20, 2023  1657 Received sign out from Dr. Martina Sledge pending CT scan [WS]    Clinical Course User Index [WS] Mordecai Applebaum, MD                                  Medical Decision Making Amount and/or Complexity of Data Reviewed Labs: ordered. Radiology: ordered.  Risk Prescription drug management.   Patient diagnosed with tonsillitis at urgent care and given amoxicillin and clindamycin presenting for worsening pain, difficulty swallowing due to pain, myalgias, generalized weakness, and subjective fevers new left-sided ear pain.  On exam the patient is tachycardic with a heart rate of 105, respiratory rate of 22, blood pressure 107/71.  His oropharynx demonstrates tonsillar swelling with concerns for tonsillar abscess on the left side.  Normal right tonsil.  Uvula is midline.  No other lesions in the mouth or oropharynx. Unable to see peritonsillar abscess at this time.   Due to tachycardia and tachypnea, I have concerns for possible sepsis.  Blood cultures, lactic acid, laboratory studies ordered at this time.  IV fluids, morphine for pain, and Unasyn started.  CT neck ordered to rule out deeper abscess/peritonsillar abscess, etc.  Patient has contrast allergy and requested no contrast at this time.  CT noncontrast ordered.  CT neck noncontrast: Examination is limited due to lack of IV contrast. Prominence of the palatine tonsils, left greater than right. Suggestion of edema within the left parapharyngeal space. Peritonsillar abscess cannot be excluded due to lack of contrast. Edematous appearance of the pharyngeal mucosa suggestive of pharyngitis. Enlarged cervical lymph nodes, likely reactive.  Labs demonstrate no leukocytosis.     Patient given medication for pain control, antibiotics, and steroids.  He is otherwise phonating without difficulty, breathing without difficulty, and swallowing without difficulty.  Tolerating his secretions.    At this time I feel patient is safe to return home with prescription for Augmentin and steroids with close ent follow up first thing Monday morning. Local specialist provided. Pt was started on  Amoxicillin instead of Augmentin initially which has poor coverage and has taken less than 4 days of the Clindamycin. I do not feel that Clinda is necessary at this time due to it's risks of C.diff. I feel as though Augmentin should cover appropriately as well as dexamethasone . F/u with ENT recommended for possible drainage if needed.  Patient in no distress and overall condition improved here in the ED. Detailed discussions were had with the patient regarding current findings, and need for close f/u with PCP or on call doctor. The patient has been instructed to return immediately if the symptoms worsen in any way for re-evaluation. Patient verbalized understanding and is in agreement with current care plan. All questions answered prior  to discharge.  Final Clinical Impression(s) / ED Diagnoses Final diagnoses:  Tonsillitis    Rx / DC Orders ED Discharge Orders          Ordered    amoxicillin-clavulanate (AUGMENTIN) 875-125 MG tablet  Every 12 hours        06/20/23 1731    predniSONE  (DELTASONE ) 10 MG tablet  Daily        06/20/23 1731              Aulton Routt P, DO 06/20/23 1732    Quinn Bucco, DO 06/20/23 1743    Quinn Bucco, DO 06/20/23 2102

## 2023-06-20 NOTE — Discharge Instructions (Addendum)
 ENT specialist recommending follow-up first thing Monday morning for reevaluation after steroids are in your system.  You were given a steroid called Decadron  in the emergency department and started on antibiotics for tonsillar abscess.  Prescription sent to your pharmacy.

## 2023-06-20 NOTE — ED Triage Notes (Signed)
 Patient diagnosed with peritonsillar abscess at [facility] and put on oral abx, patient states that the pain is not getting better and the pain has moved to his left ear.  Patient gives verbal consent for MSE.

## 2023-06-24 ENCOUNTER — Encounter (INDEPENDENT_AMBULATORY_CARE_PROVIDER_SITE_OTHER): Payer: Self-pay | Admitting: Otolaryngology

## 2023-06-24 ENCOUNTER — Ambulatory Visit (INDEPENDENT_AMBULATORY_CARE_PROVIDER_SITE_OTHER): Payer: Self-pay | Admitting: Otolaryngology

## 2023-06-24 VITALS — BP 116/78 | HR 95 | Ht 69.0 in | Wt 148.0 lb

## 2023-06-24 DIAGNOSIS — J039 Acute tonsillitis, unspecified: Secondary | ICD-10-CM

## 2023-06-24 MED ORDER — METHYLPREDNISOLONE 4 MG PO TBPK: ORAL_TABLET | ORAL | 0 refills | Status: AC

## 2023-06-24 MED ORDER — ONDANSETRON 4 MG PO TBDP: 4.0000 mg | ORAL_TABLET | Freq: Three times a day (TID) | ORAL | 0 refills | Status: AC | PRN

## 2023-06-24 NOTE — Progress Notes (Signed)
 Otolaryngology Clinic Note Referring provider: Dr. Martina Sledge HPI:  Nicholas Clayton is a 28 y.o. male kindly referred for evaluation for tonsillitis with peritonsillar abscess.  Initial visit (06/24/2023):  Patient reports that he had a sore throat and malaise starting about 3 weeks ago. Went to local UC, prescribed clindamycin, but did not get better after 4 days. Ultimately, he came to [facility], CT Neck w/o contrast was done, and then referred here for possible PTA. He was placed on augmentin  BID since 06/20/2023 and steroids. He reports that he is doing better today -- notes that his neck pain, ear pain, trismus, and sore throat have improved. Some subjective fevers but better. Denies h/o strep tonsillitis or prior PTA. No night sweats, weight loss. He is having some nausea He reports contrast allergy ("trouble breathing")  H&N Surgery: deny Personal or FHx of bleeding dz or anesthesia difficulty: no    Tobacco: deny  Independent Review of Additional Tests or Records:  Dr. Martina Sledge (06/20/2023):ED visit: noted tonsillitis, and abscess(?) with tonsil asymmetry;Dx: Tonsillitis and possible PTA; Rx: ref to ENT, augmentin , medrol. Labs 06/20/2023: WBC 7.4, GAS 06/14/2023: neg; BCX (06/20/2023): neg CT Neck WITHOUT contrast 6/62025 independently interpreted: no contrast so abscess would be difficult to see but noted b/l tonsil prominence, some left PP space edema. Airway overall appears patent; b/l level 2 LN adenopathy  PMH/Meds/All/SocHx/FamHx/ROS:  History reviewed. No pertinent past medical history.   History reviewed. No pertinent surgical history.  History reviewed. No pertinent family history.   Social Connections: Not on file      Current Outpatient Medications:    amoxicillin  (AMOXIL ) 500 MG capsule, Take 500 mg by mouth., Disp: , Rfl:    amoxicillin -clavulanate (AUGMENTIN ) 875-125 MG tablet, Take 1 tablet by mouth every 12 (twelve) hours for 10 days., Disp: 20 tablet, Rfl: 0    brompheniramine-pseudoephedrine-DM 30-2-10 MG/5ML syrup, Take 5 mLs by mouth., Disp: , Rfl:    clindamycin (CLEOCIN) 150 MG capsule, Take 150 mg by mouth 3 (three) times daily., Disp: , Rfl:    clindamycin (CLEOCIN) 300 MG capsule, Take 300 mg by mouth 3 (three) times daily., Disp: , Rfl:    clotrimazole -betamethasone  (LOTRISONE ) cream, Apply to rash on groin area 2 times daily prn, Disp: 15 g, Rfl: 0   colchicine 0.6 MG tablet, Take 0.6 mg by mouth., Disp: , Rfl:    diphenhydrAMINE (BENADRYL) 25 mg capsule, Take 25 mg by mouth., Disp: , Rfl:    famotidine (PEPCID) 20 MG tablet, Take 20 mg by mouth., Disp: , Rfl:    fluconazole  (DIFLUCAN ) 200 MG tablet, Take 1 tablet (200 mg total) by mouth daily., Disp: 7 tablet, Rfl: 0   hydrocortisone  cream 0.5 %, Apply 1 application topically 4 (four) times daily., Disp: 30 g, Rfl: 1   ibuprofen (ADVIL) 400 MG tablet, Take 400 mg by mouth., Disp: , Rfl:    methocarbamol  (ROBAXIN ) 500 MG tablet, Take 1,000 mg by mouth., Disp: , Rfl:    ondansetron  (ZOFRAN -ODT) 4 MG disintegrating tablet, Take 1 tablet (4 mg total) by mouth every 8 (eight) hours as needed for nausea or vomiting., Disp: 20 tablet, Rfl: 0   predniSONE  (DELTASONE ) 10 MG tablet, Take 2 tablets (20 mg total) by mouth daily for 5 days., Disp: 10 tablet, Rfl: 0   methylPREDNISolone (MEDROL DOSEPAK) 4 MG TBPK tablet, Take by mouth See admin instructions., Disp: 1 each, Rfl: 0   Physical Exam:   BP 116/78 (BP Location: Left Arm, Patient Position: Sitting, Cuff Size:  Normal)   Pulse 95   Ht 5\' 9"  (1.753 m)   Wt 148 lb (67.1 kg)   SpO2 97%   BMI 21.86 kg/m   Salient findings:  CN II-XII intact  Bilateral EAC clear and TM intact with well pneumatized middle ear spaces Anterior rhinoscopy: Septum intact; bilateral inferior turbinates without significant hypertrophy No lesions of oral cavity/oropharynx - EXCEPT left tonsil appears mildly erythematous, no trismus, no uvular deviation, tonsil pillars  not full and appear symmetric; palpable tongue base and GT sulcus without fullness No obviously palpable neck masses/lymphadenopathy/thyromegaly No respiratory distress or stridor  Seprately Identifiable Procedures:   None today  Impression & Plans:  [name] is a 28 y.o. male with:  1. Tonsillitis    Likely viral infection followed by bacterial superinfection. No evidence of PTA on exam today and he is feeling better symptomatically. As such, would recommend finishing augmentin  and steroids. Given some sore throat, will eRx additional medrol pack - risks discussed - and zofran  for nausea Return precautions discussed, f/u PRN otherwise  See below regarding exact medications prescribed this encounter including dosages and route: Meds ordered this encounter  Medications   methylPREDNISolone (MEDROL DOSEPAK) 4 MG TBPK tablet    Sig: Take by mouth See admin instructions.    Dispense:  1 each    Refill:  0   ondansetron  (ZOFRAN -ODT) 4 MG disintegrating tablet    Sig: Take 1 tablet (4 mg total) by mouth every 8 (eight) hours as needed for nausea or vomiting.    Dispense:  20 tablet    Refill:  0      Thank you for allowing me the opportunity to care for your patient. Please do not hesitate to contact me should you have any other questions.  Sincerely, Milon Aloe, MD Otolaryngologist (ENT), [facility] Phone: [phone] Fax: [phone]  06/24/2023, 9:26 AM   MDM:  Level 4 - [phone] Complexity/Problems addressed: mod - acute problem, systemic sx Data complexity: mod - independent review of notes, labs; independent CT interpretation - Morbidity: mod  - Prescription Drug prescribed or managed: y

## 2023-06-25 LAB — CULTURE, BLOOD (ROUTINE X 2)
Culture: NO GROWTH
Culture: NO GROWTH
Special Requests: ADEQUATE
Special Requests: ADEQUATE

## 2024-01-12 ENCOUNTER — Emergency Department (HOSPITAL_COMMUNITY)
Admission: EM | Admit: 2024-01-12 | Discharge: 2024-01-13 | Payer: Self-pay | Attending: Emergency Medicine | Admitting: Emergency Medicine

## 2024-01-12 ENCOUNTER — Encounter (HOSPITAL_COMMUNITY): Payer: Self-pay

## 2024-01-12 ENCOUNTER — Other Ambulatory Visit: Payer: Self-pay

## 2024-01-12 DIAGNOSIS — R21 Rash and other nonspecific skin eruption: Secondary | ICD-10-CM | POA: Insufficient documentation

## 2024-01-12 DIAGNOSIS — Z5321 Procedure and treatment not carried out due to patient leaving prior to being seen by health care provider: Secondary | ICD-10-CM | POA: Insufficient documentation

## 2024-01-12 NOTE — ED Triage Notes (Signed)
 Patient states that he has a rash under his neck, scalp and under arms, eyelids. He says that he has had this before and it goes away.   He states that this has been going on for 2 weeks.  He has tried benadryl and anti itch cream and otc medications and nothing is working.

## 2024-02-07 ENCOUNTER — Emergency Department (HOSPITAL_COMMUNITY)
Admission: EM | Admit: 2024-02-07 | Discharge: 2024-02-07 | Disposition: A | Discharge status: Home / Self Care | Payer: Self-pay | Attending: Emergency Medicine | Admitting: Emergency Medicine

## 2024-02-07 ENCOUNTER — Encounter (HOSPITAL_COMMUNITY): Payer: Self-pay

## 2024-02-07 ENCOUNTER — Other Ambulatory Visit: Payer: Self-pay

## 2024-02-07 DIAGNOSIS — L239 Allergic contact dermatitis, unspecified cause: Secondary | ICD-10-CM | POA: Insufficient documentation

## 2024-02-07 DIAGNOSIS — R7309 Other abnormal glucose: Secondary | ICD-10-CM | POA: Insufficient documentation

## 2024-02-07 LAB — CBG MONITORING, ED: Glucose-Capillary: 86 mg/dL (ref 70–99)

## 2024-02-07 MED ORDER — METHYLPREDNISOLONE SODIUM SUCC 125 MG IJ SOLR
60.0000 mg | Freq: Once | INTRAMUSCULAR | Status: AC
  Administered 2024-02-07: 60 mg via INTRAMUSCULAR
  Filled 2024-02-07: qty 2

## 2024-02-07 MED ORDER — PREDNISONE 10 MG (21) PO TBPK: ORAL_TABLET | Freq: Every day | ORAL | 0 refills | Status: AC

## 2024-02-07 MED ORDER — HYDROXYZINE HCL 25 MG PO TABS: 25.0000 mg | ORAL_TABLET | Freq: Four times a day (QID) | ORAL | 0 refills | Status: AC | PRN

## 2024-02-07 NOTE — ED Triage Notes (Signed)
 Pt presents to ED from home C/O rash and itching X 1 month. Reports red raised bumps intermittently throughout body. States [facility] ED told him it was eczema, has not used prescribed lotion, requesting further evaluation.

## 2024-02-07 NOTE — ED Notes (Signed)
AVS with prescriptions provided to and discussed with patient and family member at bedside. Pt verbalizes understanding of discharge instructions and denies any questions or concerns at this time. Pt ambulated out of department independently with steady gait.  

## 2024-02-07 NOTE — ED Provider Notes (Signed)
 " [place] EMERGENCY DEPARTMENT AT [facility] Provider Note   CSN: [id number] Arrival date & time: 02/07/24  9072     Patient presents with: Rash   Nicholas Clayton is a 29 y.o. male.   Patient is a 29 year old male who presents to the emergency department with a chief complaint of generalized itching and rash which has been present for approximate the past month.  He was recently seen at another emergency department 2 days ago and directed to use Selsun Blue which she has not been using.  He notes that he was diagnosed with eczema at that time.  Patient notes he has had no rash to palms, soles, oral mucosa.  He does note that there has been a new detergent.   Rash      Prior to Admission medications  Medication Sig Start Date End Date Taking? Authorizing Provider  hydrOXYzine  (ATARAX ) 25 MG tablet Take 1 tablet (25 mg total) by mouth every 6 (six) hours as needed for itching. 02/07/24  Yes Daralene Bruckner D, PA-C  predniSONE  (STERAPRED UNI-PAK 21 TAB) 10 MG (21) TBPK tablet Take by mouth daily. Take 6 tabs by mouth daily  for 2 days, then 5 tabs for 2 days, then 4 tabs for 2 days, then 3 tabs for 2 days, 2 tabs for 2 days, then 1 tab by mouth daily for 2 days 02/07/24  Yes Deyton Ellenbecker D, PA-C  brompheniramine-pseudoephedrine-DM 30-2-10 MG/5ML syrup Take 5 mLs by mouth. 02/21/23   [provider]  clindamycin (CLEOCIN) 150 MG capsule Take 150 mg by mouth 3 (three) times daily. 06/16/23   [provider]  clindamycin (CLEOCIN) 300 MG capsule Take 300 mg by mouth 3 (three) times daily. 06/16/23   [provider]  clotrimazole -betamethasone  (LOTRISONE ) cream Apply to rash on groin area 2 times daily prn 09/29/18   Haze Bruckner PARAS, MD  colchicine 0.6 MG tablet Take 0.6 mg by mouth. 11/26/22   [provider]  diphenhydrAMINE (BENADRYL) 25 mg capsule Take 25 mg by mouth. 05/24/23   [provider]  famotidine (PEPCID) 20 MG  tablet Take 20 mg by mouth. 05/24/23   [provider]  fluconazole  (DIFLUCAN ) 200 MG tablet Take 1 tablet (200 mg total) by mouth daily. 09/29/18   Haze Bruckner PARAS, MD  hydrocortisone  cream 0.5 % Apply 1 application topically 4 (four) times daily. 02/12/18   Mesner, Jason, MD  ibuprofen (ADVIL) 400 MG tablet Take 400 mg by mouth. 11/30/19   [provider]  methocarbamol  (ROBAXIN ) 500 MG tablet Take 1,000 mg by mouth. 11/25/22   [provider]  methylPREDNISolone  (MEDROL  DOSEPAK) 4 MG TBPK tablet Take by mouth See admin instructions. 06/24/23   Tobie Eldora NOVAK, MD  ondansetron  (ZOFRAN -ODT) 4 MG disintegrating tablet Take 1 tablet (4 mg total) by mouth every 8 (eight) hours as needed for nausea or vomiting. 06/24/23   Tobie Eldora NOVAK, MD    Allergies: Iodinated contrast media, Aspirin, and Gadolinium derivatives    Review of Systems  Skin:  Positive for rash.  All other systems reviewed and are negative.   Updated Vital Signs BP 133/77 (BP Location: Right Arm)   Pulse 86   Temp 97.7 F (36.5 C) (Oral)   Resp 18   SpO2 99%   Physical Exam Vitals and nursing note reviewed.  Constitutional:      General: He is not in acute distress.    Appearance: Normal appearance. He is not ill-appearing.  HENT:     Head: Normocephalic and atraumatic.     Nose: Nose normal.     Mouth/Throat:     Mouth: Mucous membranes are moist.  Eyes:     Extraocular Movements: Extraocular movements intact.     Conjunctiva/sclera: Conjunctivae normal.     Pupils: Pupils are equal, round, and reactive to light.  Cardiovascular:     Rate and Rhythm: Normal rate and regular rhythm.     Pulses: Normal pulses.     Heart sounds: Normal heart sounds. No murmur heard.    No gallop.  Pulmonary:     Effort: Pulmonary effort is normal. No respiratory distress.     Breath sounds: Normal breath sounds. No stridor. No wheezing, rhonchi or rales.  Abdominal:     General: Abdomen is flat.  Bowel sounds are normal. There is no distension.     Palpations: Abdomen is soft.     Tenderness: There is no abdominal tenderness. There is no guarding.  Musculoskeletal:        General: Normal range of motion.     Cervical back: Normal range of motion and neck supple. No rigidity or tenderness.  Skin:    General: Skin is warm and dry.     Comments: Papular rash noted over trunk and bilateral extremities as well as neck, no petechiae, purpura, bullae, no lesions to palms, soles, oral mucosa  Neurological:     General: No focal deficit present.     Mental Status: He is alert and oriented to person, place, and time. Mental status is at baseline.  Psychiatric:        Mood and Affect: Mood normal.        Behavior: Behavior normal.        Thought Content: Thought content normal.        Judgment: Judgment normal.     (all labs ordered are listed, but only abnormal results are displayed) Labs Reviewed  CBG MONITORING, ED    EKG: None  Radiology: No results found.   Procedures   Medications Ordered in the ED  methylPREDNISolone  sodium succinate (SOLU-MEDROL ) 125 mg/2 mL injection 60 mg (60 mg Intramuscular Given 02/07/24 1034)                                    Medical Decision Making Patient is doing well at this time and is stable for discharge home.  Suspect contact dermatitis at this point.  He has no indication for Elspeth Louder syndrome, toxic epidermal necrolysis, tickborne illness, syphilis, meningitis, scabies.  He has no signs of anaphylaxis or angioedema.  Vital signs are otherwise stable.  Fingerstick glucose demonstrated no indication for diabetes.  Close follow-up with primary care doctor was discussed as well as strict turn precautions for any new or worsening symptoms.  Patient voiced understanding and had no additional questions.  Risk Prescription drug management.        Final diagnoses:  Allergic contact dermatitis, unspecified trigger    ED  Discharge Orders          Ordered    predniSONE  (STERAPRED UNI-PAK 21 TAB) 10 MG (21) TBPK tablet  Daily        02/07/24 1035    hydrOXYzine  (ATARAX ) 25 MG tablet  Every 6 hours PRN        02/07/24 1035  Daralene Lonni BIRCH, PA-C 02/07/24 1044    Suzette Pac, MD 02/07/24 2000  "

## 2024-02-07 NOTE — Discharge Instructions (Signed)
 Please follow-up closely with a primary care doctor on an outpatient basis.  Return to emergency department immediately for any new or worsening symptoms.  Please take all medications as directed.  [facility] Primary Care Doctor List    Rollene Pesa, MD. Specialty: [facility] Medicine Contact information: [address]  [place] [address]  [phone]   Glendia Fielding, MD. Specialty: [facility] Medicine Contact information: [address]  [place] [address]  [phone]   Benita Outhouse, MD Specialty: Internal Medicine Contact information: [address] [place] [address]  [phone]   Darlyn Hurst, MD. Specialty: Internal Medicine Contact information: [address]  [place] [address]  [phone]    [facility] (Dr. Luke) Specialty: Family Medicine Contact information: [address]  [place] [address]  [phone]   Garnette Lolling, MD. Specialty: [facility] Medicine Contact information: [address]  [address]  [place] [address]  [phone]   Gaither Langton, MD. Specialty: Internal Medicine Contact information: [address]  [address]  [place] [address]  [phone]   [facility] Family Medicine: [address] [phone]  [place], Family medicine [address]  [phone]  [facility] [address] [place], [place] [phone]  [facility]: [address] [phone]    [facility]  [address] [place], [place] [address] [phone]  Services [facility] offers a variety of basic health services.  Services include but are not limited to: Blood pressure checks  Heart rate checks  Blood sugar checks  Urine analysis  Rapid strep tests  Pregnancy tests.  Health education and referrals  People needing more complex services will be directed to a physician online. Using  these virtual visits, doctors can evaluate and prescribe medicine and treatments. There will be no medication on-site, though [facility] will help patients fill their prescriptions at little to no cost.   For More information please go to: [email or web address]  Allergy and Asthma:    [address] [facility] [address] [address] [place] [phone]  Urology:  [address]  [place] [phone]  [facility]  [address] [place], [place] [phone]  Orthopedics   [address] [place], [place] [phone]  Endocrinology  [address] [place], [place] [phone]  Podiatry: [facility] [phone]
